# Patient Record
Sex: Male | Born: 1950
Health system: Southern US, Community
[De-identification: ages and names within clinical notes are randomized; demographics above are authoritative.]

## PROBLEM LIST (undated history)

## (undated) DIAGNOSIS — Z8672 Personal history of thrombophlebitis: Secondary | ICD-10-CM

## (undated) DIAGNOSIS — N529 Male erectile dysfunction, unspecified: Secondary | ICD-10-CM

## (undated) DIAGNOSIS — E7800 Pure hypercholesterolemia, unspecified: Secondary | ICD-10-CM

## (undated) DIAGNOSIS — Z9889 Other specified postprocedural states: Secondary | ICD-10-CM

## (undated) DIAGNOSIS — I10 Essential (primary) hypertension: Secondary | ICD-10-CM

## (undated) DIAGNOSIS — IMO0002 Reserved for concepts with insufficient information to code with codable children: Secondary | ICD-10-CM

## (undated) DIAGNOSIS — Z7901 Long term (current) use of anticoagulants: Secondary | ICD-10-CM

## (undated) DIAGNOSIS — G454 Transient global amnesia: Secondary | ICD-10-CM

## (undated) DIAGNOSIS — H81312 Aural vertigo, left ear: Secondary | ICD-10-CM

## (undated) DIAGNOSIS — C61 Malignant neoplasm of prostate: Principal | ICD-10-CM

## (undated) DIAGNOSIS — Z87442 Personal history of urinary calculi: Secondary | ICD-10-CM

## (undated) DIAGNOSIS — I4510 Unspecified right bundle-branch block: Secondary | ICD-10-CM

## (undated) DIAGNOSIS — G43909 Migraine, unspecified, not intractable, without status migrainosus: Secondary | ICD-10-CM

## (undated) DIAGNOSIS — Z86718 Personal history of other venous thrombosis and embolism: Secondary | ICD-10-CM

## (undated) DIAGNOSIS — Z85828 Personal history of other malignant neoplasm of skin: Secondary | ICD-10-CM

## (undated) DIAGNOSIS — D6851 Activated protein C resistance: Secondary | ICD-10-CM

## (undated) DIAGNOSIS — J189 Pneumonia, unspecified organism: Secondary | ICD-10-CM

## (undated) HISTORY — DX: Migraine, unspecified, not intractable, without status migrainosus: G43.909

## (undated) HISTORY — PX: TONSILLECTOMY: SUR1361

## (undated) HISTORY — DX: Essential (primary) hypertension: I10

## (undated) HISTORY — PX: KIDNEY STONE SURGERY: SHX686

## (undated) HISTORY — DX: Transient global amnesia: G45.4

---

## 2010-01-20 ENCOUNTER — Encounter: Admission: RE | Admit: 2010-01-20 | Discharge: 2010-01-20 | Payer: Self-pay | Admitting: Internal Medicine

## 2010-04-13 DIAGNOSIS — Z8672 Personal history of thrombophlebitis: Secondary | ICD-10-CM

## 2010-04-13 HISTORY — DX: Personal history of thrombophlebitis: Z86.72

## 2010-05-10 ENCOUNTER — Ambulatory Visit: Payer: Self-pay | Admitting: Vascular Surgery

## 2010-06-13 HISTORY — PX: COLONOSCOPY: SHX174

## 2010-10-26 NOTE — Procedures (Signed)
DUPLEX DEEP VENOUS EXAM - LOWER EXTREMITY   INDICATION:  Right lower extremity pain, swelling, redness.  History of  right lower extremity DVT.   HISTORY:  Edema:  No.  Trauma/Surgery:  No.  Pain:  Yes.  PE:  No.  Previous DVT:  Right DVT in 2003.  Anticoagulants:  Aspirin.  Other:   DUPLEX EXAM:                CFV   SFV   PopV  PTV    GSV                R  L  R  L  R  L  R   L  R  L  Thrombosis    0  0  0     0     +      0  Spontaneous   +  +  +     +     0      +  Phasic        +  +  +     +     0      +  Augmentation  +  +  +     +     0      +  Compressible  +  +  +     +     0      +  Competent     +  +  +     +     0      +   Legend:  + - yes  o - no  p - partial  D - decreased   IMPRESSION:  1. Right lower extremity is positive for deep venous thrombosis in the      posterior tibial veins from the proximal calf to the distal calf.  2. Superficial phlebitis is also noted in the mid calf.  3. Left common femoral vein appears to be negative for deep venous      thrombosis.  4. Preliminary report called to Corrie Dandy, FNP - the patient released to go      home and the doctor will call him this evening.        _____________________________  Di Kindle. Edilia Bo, M.D.   NT/MEDQ  D:  05/11/2010  T:  05/11/2010  Job:  [id number]

## 2010-12-16 ENCOUNTER — Encounter (INDEPENDENT_AMBULATORY_CARE_PROVIDER_SITE_OTHER): Payer: Self-pay | Admitting: Surgery

## 2010-12-21 ENCOUNTER — Encounter (INDEPENDENT_AMBULATORY_CARE_PROVIDER_SITE_OTHER): Payer: Self-pay | Admitting: Surgery

## 2010-12-22 ENCOUNTER — Ambulatory Visit (INDEPENDENT_AMBULATORY_CARE_PROVIDER_SITE_OTHER): Payer: 59 | Admitting: Surgery

## 2010-12-22 ENCOUNTER — Encounter (INDEPENDENT_AMBULATORY_CARE_PROVIDER_SITE_OTHER): Payer: Self-pay | Admitting: Surgery

## 2010-12-22 VITALS — BP 152/110 | HR 64 | Temp 97.0°F | Ht 75.0 in | Wt 242.4 lb

## 2010-12-22 DIAGNOSIS — D6859 Other primary thrombophilia: Secondary | ICD-10-CM

## 2010-12-22 DIAGNOSIS — K429 Umbilical hernia without obstruction or gangrene: Secondary | ICD-10-CM | POA: Insufficient documentation

## 2010-12-22 DIAGNOSIS — I10 Essential (primary) hypertension: Secondary | ICD-10-CM

## 2010-12-22 DIAGNOSIS — D6851 Activated protein C resistance: Secondary | ICD-10-CM

## 2010-12-22 NOTE — Progress Notes (Signed)
The patient is a 60 year old white male who is a patient of Dr. Merri Brunette. He comes for my evaluation of an umbilical hernia.  The patient has noticed an umbilical hernia about 5 years. He first noticed this when he was doing some weightlifting or training. In the last 6 months or so he's noticed an increased size of the hernia. He is also noticed some increased pain with hernia which is brief.  He's had no prior abdominal surgery. He denies a history of stomach, liver, gallbladder, pancreas, or colon disease. He has been found to have benign liver cyst. He had a colonoscopy at age 67 performed in [place].  ROS: Neurologic: He had headaches as a teenager. He has had no stroke. Cardiac: He has been hypertensive for 40 years. She's never had a heart attack, no cardiac tissues. Pulmonary: He has no history of asthma. Does not smoke cigarettes. Gastrointestinal: See history of present illness. Urologic: No history of kidney or prostate disease. Hematologic: He has had 2 events were blood clots in his right leg. He has been diagnosed with factor V Leiden deficiency. He is on chronic Coumadin for the blood clots. Musculoskeletal: He had a compression fracture of his lower back 20 years ago and has some arthritis in that joint.  Personal history: He is accompanied by his wife. He works at the [employer] for the [employer].  PE: Head: HEENT normal. Neck: Supple, no thyroid mass. Lymph nodes: No cervical or suprapubic or adenopathy. Lungs: Clear. Heart: Regular rate and rhythm, no murmur. Abdomen: Soft, no mass, no organomegaly. He has a small reducible umbilical hernia. I feel no inguinal hernia. Extremities: Good strength in upper and lower extremity.  Data Reviewed: I reviewed the notes from Dr. Hulan Fess office.  Assessement and plan: #1. Small, reducible umbilical hernia. I think the patient has option of continuing to observe the umbilical hernia versus seating the surgery  for the hernia. I gave him literature on umbilical hernias. Discussed the potential palpitations of surgery which include bleeding, infection, nerve injury, recurrence of the hernia. #2. Factor V Leiden deficiency. #3. On Coumadin. This will have to be stopped perioperatively. (Last INR 2.8.  Spoke with Harriett Sine, nurse, Claude Manges, pharmacologist who manages coumadin.) #4. Hypertension. #5. History of compression fracture of low back.

## 2010-12-22 NOTE — Patient Instructions (Addendum)
#  1. Discussed options of repair of umbilical hernia.  #2. Given literature on umbilical hernia.  #3. Will need to stop Coumadin 5 days prior surgery.  Harriett Sine, Glynis Smiles - pharmacologist)

## 2011-02-10 DIAGNOSIS — K429 Umbilical hernia without obstruction or gangrene: Secondary | ICD-10-CM

## 2011-02-10 HISTORY — PX: UMBILICAL HERNIA REPAIR: SHX196

## 2011-02-21 ENCOUNTER — Encounter (INDEPENDENT_AMBULATORY_CARE_PROVIDER_SITE_OTHER): Payer: Self-pay

## 2011-02-24 ENCOUNTER — Ambulatory Visit (INDEPENDENT_AMBULATORY_CARE_PROVIDER_SITE_OTHER): Payer: 59 | Admitting: Surgery

## 2011-02-24 ENCOUNTER — Encounter (INDEPENDENT_AMBULATORY_CARE_PROVIDER_SITE_OTHER): Payer: Self-pay | Admitting: Surgery

## 2011-02-24 VITALS — BP 160/110 | HR 78

## 2011-02-24 DIAGNOSIS — Z87898 Personal history of other specified conditions: Secondary | ICD-10-CM

## 2011-02-24 DIAGNOSIS — Z789 Other specified health status: Secondary | ICD-10-CM

## 2011-02-24 NOTE — Progress Notes (Signed)
ASSESSMENT AND PLAN: 1. Small, reducible umbilical hernia. Repaired 02/10/2011 at [facility].    2. Factor V Leiden deficiency.  3. On Coumadin. This will have to be stopped perioperatively. (Last INR 2.8. Spoke with Craig Booth, nurse, Craig Booth, pharmacologist who manages coumadin.)  4. Hypertension.  5. History of compression fracture of low back.   HISTORY OF PRESENT ILLNESS: Post op follow up.  Craig Booth is a 60 y.o. (DOB: 17-Dec-1950)  white male who is a patient of PHARR,WALTER DAVIDSON, MD and comes to me today for umbilical hernia follow up.  He has done well with the repair.  He is back on coumadin, but has not been tested yet.  We talked about return to activity.  PHYSICAL EXAM: BP 160/110  Pulse 78  Abdomen:  Wound looks good.  No bruising.  Wearing abdominal binder.  I encouraged him to wear this for about 4 weeks total.  DATA REVIEWED: None.

## 2011-02-25 ENCOUNTER — Encounter (INDEPENDENT_AMBULATORY_CARE_PROVIDER_SITE_OTHER): Payer: 59 | Admitting: Surgery

## 2011-03-03 ENCOUNTER — Encounter (INDEPENDENT_AMBULATORY_CARE_PROVIDER_SITE_OTHER): Payer: 59 | Admitting: Surgery

## 2012-06-13 DIAGNOSIS — C61 Malignant neoplasm of prostate: Secondary | ICD-10-CM | POA: Insufficient documentation

## 2012-06-25 DIAGNOSIS — C61 Malignant neoplasm of prostate: Secondary | ICD-10-CM

## 2012-06-25 HISTORY — DX: Malignant neoplasm of prostate: C61

## 2012-06-25 HISTORY — PX: PROSTATE BIOPSY: SHX241

## 2012-07-10 ENCOUNTER — Encounter: Payer: Self-pay | Admitting: Radiation Oncology

## 2012-07-10 DIAGNOSIS — I809 Phlebitis and thrombophlebitis of unspecified site: Secondary | ICD-10-CM | POA: Insufficient documentation

## 2012-07-10 NOTE — Progress Notes (Signed)
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

## 2012-07-11 ENCOUNTER — Ambulatory Visit
Admission: RE | Admit: 2012-07-11 | Discharge: 2012-07-11 | Disposition: A | Discharge status: Home / Self Care | Payer: 59 | Source: Ambulatory Visit | Attending: Radiation Oncology | Admitting: Radiation Oncology

## 2012-07-11 ENCOUNTER — Encounter: Payer: Self-pay | Admitting: Radiation Oncology

## 2012-07-11 VITALS — BP 128/102 | HR 78 | Temp 97.3°F | Resp 20 | Ht 75.0 in | Wt 236.4 lb

## 2012-07-11 DIAGNOSIS — Z86718 Personal history of other venous thrombosis and embolism: Secondary | ICD-10-CM | POA: Insufficient documentation

## 2012-07-11 DIAGNOSIS — I10 Essential (primary) hypertension: Secondary | ICD-10-CM

## 2012-07-11 DIAGNOSIS — C61 Malignant neoplasm of prostate: Secondary | ICD-10-CM

## 2012-07-11 DIAGNOSIS — D6859 Other primary thrombophilia: Secondary | ICD-10-CM | POA: Insufficient documentation

## 2012-07-11 DIAGNOSIS — Z7901 Long term (current) use of anticoagulants: Secondary | ICD-10-CM | POA: Insufficient documentation

## 2012-07-11 DIAGNOSIS — E7800 Pure hypercholesterolemia, unspecified: Secondary | ICD-10-CM | POA: Insufficient documentation

## 2012-07-11 DIAGNOSIS — Z79899 Other long term (current) drug therapy: Secondary | ICD-10-CM | POA: Insufficient documentation

## 2012-07-11 HISTORY — DX: Male erectile dysfunction, unspecified: N52.9

## 2012-07-11 HISTORY — DX: Activated protein C resistance: D68.51

## 2012-07-11 HISTORY — DX: Reserved for concepts with insufficient information to code with codable children: IMO0002

## 2012-07-11 HISTORY — DX: Malignant neoplasm of prostate: C61

## 2012-07-11 HISTORY — DX: Pure hypercholesterolemia, unspecified: E78.00

## 2012-07-11 NOTE — Progress Notes (Signed)
Radiation Oncology         (336) [phone] ________________________________  Initial outpatient Consultation  Name: Craig Booth MRN: [id number]  Date: 07/11/2012  DOB: [birth year]  AO:ZHYQM,VHQION DAVIDSON, MD  Anner Crete, MD   REFERRING PHYSICIAN: Anner Crete, MD  DIAGNOSIS: 62 y.o. gentleman with stage T2a adenocarcinoma of the prostate with a Gleason's score of 3+3 and a PSA of 3.8  HISTORY OF PRESENT ILLNESS::[name] is a 62 y.o. gentleman.  He was noted to have an elevated PSA of 3.8, up from 2.5 in one year, by his primary care physician, Dr. Renne Crigler.  Accordingly, he was referred for evaluation in urology by Dr. Annabell Howells on 10/17,  digital rectal examination was performed at that time revealing a firm palpable nodule in the left apex about 5 mm deep in a 2+ gland.  The patient proceeded to transrectal ultrasound with 12 biopsies of the prostate on 1/13.  The prostate volume measured 44.68 cc.  Out of 12 core biopsies,3 were positive.  The maximum Gleason score was 3+3, and this was seen in the right base (20%), right mid (<5%), and left mid (70%) cores.  The patient reviewed the biopsy results with his urologist and he has kindly been referred today for discussion of potential radiation treatment options.  PREVIOUS RADIATION THERAPY: No  PAST MEDICAL HISTORY:  has a past medical history of Migraine; Phlebitis; Hypertension; Prostate cancer (06/25/2012); Factor V Leiden mutation; Organic impotence; Hypercholesterolemia; DVT of lower extremity (deep venous thrombosis); Allergy; Skin cancer; Compressed vertebrae; and ED (erectile dysfunction).    PAST SURGICAL HISTORY: Past Surgical History  Procedure Date  . Tonsillectomy   . Umbilical hernia repair 02/10/2011  . Hernia repair   . Prostate biopsy 06/25/2012    Adenocarcinoma    FAMILY HISTORY: family history includes Cancer in his father.  SOCIAL HISTORY:  reports that he has never smoked. He has never used smokeless  tobacco. He reports that he drinks alcohol. He reports that he does not use illicit drugs.  ALLERGIES: Bee venom  MEDICATIONS:  Current Outpatient Prescriptions  Medication Sig Dispense Refill  . Ascorbic Acid (VITAMIN C) 100 MG tablet Take 100 mg by mouth daily.        Marland Kitchen losartan-hydrochlorothiazide (HYZAAR) 100-25 MG per tablet Take 1 tablet by mouth daily.        . Magnesium 400 MG CAPS Take 1 capsule by mouth every other day.      . Multiple Vitamin (MULTIVITAMIN) capsule Take 1 capsule by mouth daily.        Marland Kitchen warfarin (COUMADIN) 5 MG tablet Take 5 mg by mouth daily.          REVIEW OF SYSTEMS:  A 15 point review of systems is documented in the electronic medical record. This was obtained by the nursing staff. However, I reviewed this with the patient to discuss relevant findings and make appropriate changes.  A comprehensive review of systems was negative..  The patient completed an IPSS and IIEF questionnaire.  His IPSS score was 2 indicating mild urinary outflow obstructive symptoms.  He indicated that his erectile function is able to complete sexual activity about half the time.   PHYSICAL EXAM: This patient is in no acute distress.  He is alert and oriented.   height is 6\' 3"  (1.905 m) and weight is 236 lb 6.4 oz (107.23 kg). His oral temperature is 97.3 F (36.3 C). His blood pressure is 128/102 and his pulse is 78. His respiration  is 20.  He exhibits no respiratory distress or labored breathing.  He appears neurologically intact.  His mood is pleasant.  His affect is appropriate.  Please note the digital rectal exam findings described above.  IMPRESSION: This gentleman is a 62 y.o. gentleman with stage T2a adenocarcinoma of the prostate with a Gleason's score of 3+3 and a PSA of 3.8.  His T-Stage, Gleason's Score, and PSA put him into the favorable risk group.  Accordingly he is eligible for a variety of potential treatment options including watchful waiting, prostatectomy, or  radiation with external treatment or seeds.  PLAN:Today I reviewed the findings and workup thus far.  We discussed the natural history of prostate cancer.  We reviewed the the implications of T-stage, Gleason's Score, and PSA on decision-making and outcomes in prostate cancer.  We discussed radiation treatment in the management of prostate cancer with regard to the logistics and delivery of external beam radiation treatment as well as the logistics and delivery of prostate brachytherapy.  We compared and contrasted each of these approaches and also compared these against prostatectomy.  The patient expressed interest in prostate brachytherapy.  I filled out a patient counseling form for him with relevant treatment diagrams and we retained a copy for our records.   The patient is still gathering information, but is leaning toward prostate brachytherapy.  I will share my findings with Dr. Annabell Howells and if he decides for brachytherapy, we will move forward with scheduling the procedure in the near future.     I enjoyed meeting with him today, and will look forward to participating in the care of this very nice gentleman.   I spent 60 minutes face to face with the patient and more than 50% of that time was spent in counseling and/or coordination of care.   ------------------------------------------------  Artist Pais. Kathrynn Running, M.D.

## 2012-07-11 NOTE — Progress Notes (Signed)
Please see the Nurse Progress Note in the MD Initial Consult Encounter for this patient. 

## 2012-09-03 ENCOUNTER — Encounter: Payer: Self-pay | Admitting: Radiation Oncology

## 2012-09-03 NOTE — Progress Notes (Signed)
  Radiation Oncology         (336) [phone] ________________________________  Name: Craig Booth MRN: [id number]  Date: 09/03/2012  DOB: [birth year]  Telephone contact:  I received a message to call this patient and returned the phone call.  I reached his voicemail and left a message giving my desk phone number with voicemail.  I also indicated my hours of availability over the next few days. ________________________________  [name], M.D.

## 2012-09-10 ENCOUNTER — Telehealth: Payer: Self-pay | Admitting: *Deleted

## 2012-09-10 ENCOUNTER — Other Ambulatory Visit: Payer: Self-pay | Admitting: Urology

## 2012-09-10 NOTE — Telephone Encounter (Signed)
Called patient to inform of pre-seed appt. For 09-14-12 at 10:00 am, lvm for a return call

## 2012-09-11 ENCOUNTER — Telehealth: Payer: Self-pay | Admitting: *Deleted

## 2012-09-11 NOTE — Telephone Encounter (Signed)
Called patient to inform of implant date, spoke with patient and he is aware of this date. 

## 2012-09-13 ENCOUNTER — Telehealth: Payer: Self-pay | Admitting: *Deleted

## 2012-09-13 NOTE — Telephone Encounter (Signed)
CALLED PATIENT TO REMIND OF APPT. FOR PRE-SEED CT ON 09-14-12 AT 10:00 AM, LVM FOR A RETURN CALL

## 2012-09-14 ENCOUNTER — Encounter (HOSPITAL_BASED_OUTPATIENT_CLINIC_OR_DEPARTMENT_OTHER)
Admission: RE | Admit: 2012-09-14 | Discharge: 2012-09-14 | Disposition: A | Discharge status: Home / Self Care | Payer: 59 | Source: Ambulatory Visit | Attending: Urology | Admitting: Urology

## 2012-09-14 ENCOUNTER — Ambulatory Visit (HOSPITAL_BASED_OUTPATIENT_CLINIC_OR_DEPARTMENT_OTHER)
Admission: RE | Admit: 2012-09-14 | Discharge: 2012-09-14 | Disposition: A | Discharge status: Home / Self Care | Payer: 59 | Source: Ambulatory Visit | Attending: Urology | Admitting: Urology

## 2012-09-14 ENCOUNTER — Ambulatory Visit
Admission: RE | Admit: 2012-09-14 | Discharge: 2012-09-14 | Disposition: A | Discharge status: Home / Self Care | Payer: 59 | Source: Ambulatory Visit | Attending: Radiation Oncology | Admitting: Radiation Oncology

## 2012-09-14 DIAGNOSIS — I10 Essential (primary) hypertension: Secondary | ICD-10-CM | POA: Insufficient documentation

## 2012-09-14 DIAGNOSIS — C61 Malignant neoplasm of prostate: Secondary | ICD-10-CM | POA: Insufficient documentation

## 2012-09-14 DIAGNOSIS — Z01818 Encounter for other preprocedural examination: Secondary | ICD-10-CM | POA: Insufficient documentation

## 2012-09-14 DIAGNOSIS — Z01810 Encounter for preprocedural cardiovascular examination: Secondary | ICD-10-CM | POA: Insufficient documentation

## 2012-09-14 NOTE — Progress Notes (Signed)
  Radiation Oncology         [phone]) [phone] ________________________________  Name: Craig Booth MRN: [id number]  Date: 09/14/2012  DOB: 1951/06/01  SIMULATION AND TREATMENT PLANNING NOTE PUBIC ARCH STUDY  WJ:XBJYN,WGNFAO Ignacia Palma, MD  Anner Crete, MD  DIAGNOSIS: 62 y.o. gentleman with stage T2a adenocarcinoma of the prostate with a Gleason's score of 3+3 and a PSA of 3.8  COMPLEX SIMULATION:  The patient presented today for evaluation for possible prostate seed implant. He was brought to the radiation planning suite and placed supine on the CT couch. A 3-dimensional image study set was obtained in upload to the planning computer. There, on each axial slice, I contoured the prostate gland. Then, using three-dimensional radiation planning tools I reconstructed the prostate in view of the structures from the transperineal needle pathway to assess for possible pubic arch interference. In doing so, I did not appreciate any pubic arch interference. Also, the patient's prostate volume was estimated based on the drawn structure. The volume was 46 cc.  Given the pubic arch appearance and prostate volume, patient remains a good candidate to proceed with prostate seed implant. Today, he freely provided informed written consent to proceed.    PLAN: The patient will undergo prostate seed implant.   ________________________________  Artist Pais. Kathrynn Running, M.D.

## 2012-10-18 ENCOUNTER — Other Ambulatory Visit: Payer: Self-pay | Admitting: Internal Medicine

## 2012-10-18 ENCOUNTER — Ambulatory Visit
Admission: RE | Admit: 2012-10-18 | Discharge: 2012-10-18 | Disposition: A | Discharge status: Home / Self Care | Payer: 59 | Source: Ambulatory Visit | Attending: Internal Medicine | Admitting: Internal Medicine

## 2012-10-18 DIAGNOSIS — R42 Dizziness and giddiness: Secondary | ICD-10-CM

## 2012-10-18 DIAGNOSIS — H5500 Unspecified nystagmus: Secondary | ICD-10-CM

## 2012-10-31 ENCOUNTER — Encounter (HOSPITAL_BASED_OUTPATIENT_CLINIC_OR_DEPARTMENT_OTHER): Payer: Self-pay | Admitting: *Deleted

## 2012-10-31 ENCOUNTER — Telehealth: Payer: Self-pay | Admitting: *Deleted

## 2012-10-31 NOTE — Telephone Encounter (Signed)
Called patient to remind of appt., lvm for a return call 

## 2012-11-02 ENCOUNTER — Encounter (HOSPITAL_BASED_OUTPATIENT_CLINIC_OR_DEPARTMENT_OTHER): Payer: Self-pay | Admitting: *Deleted

## 2012-11-02 NOTE — Progress Notes (Signed)
NPO AFTER MN. ARRIVES AT 0615. CURRENT CXR AND EKG IN EPIC AND CHART. LAB WORK TO BE DONE ON 11-06-2012 AT 0800. WILL DO FLEET ENEMA AM OF SURG .

## 2012-11-06 LAB — COMPREHENSIVE METABOLIC PANEL
ALT: 33 U/L (ref 0–53)
Albumin: 3.6 g/dL (ref 3.5–5.2)
CO2: 28 mEq/L (ref 19–32)
Creatinine, Ser: 1.08 mg/dL (ref 0.50–1.35)
GFR calc non Af Amer: 72 mL/min — ABNORMAL LOW (ref >90)
Potassium: 4.1 mEq/L (ref 3.5–5.1)

## 2012-11-06 LAB — CBC
Hemoglobin: 14.5 g/dL (ref 13.0–17.0)
MCV: 86.6 fL (ref 78.0–100.0)
RBC: 4.64 MIL/uL (ref 4.22–5.81)

## 2012-11-07 ENCOUNTER — Telehealth: Payer: Self-pay | Admitting: *Deleted

## 2012-11-07 NOTE — Telephone Encounter (Signed)
Called patient to remind of procedure for 11-08-12, spoke with patient and he is aware of this procedure

## 2012-11-07 NOTE — H&P (Signed)
istory of Present Illness   Craig Booth returns today to discuss his biopsy results.  He has T2a Nx Mx Gleason 6 prostate cancer.    He had 70% involvement of the left mid medial core which was the location of his nodule.  He had 2 cores at the right base medial and right mid medial with <5 and 20% involvement and he had 4 cores with HGPIN.  His prostate volume is 44cc.  His IPSS is 2 and he has moderate ED with a SHIM of 14.  A Kattan nomogram predicts a 23% chance of indolent disease, an 86% chance of organ confined disease, a 19% chance of ECE, a 1% chance of SVI and a 1.3% chance of LNI.  He has had a prior umbilical hernia repair and has factor V Lieden deficiency and is on warfarin.   Past Medical History Problems  1. History of  Hypercholesterolemia 272.0 2. History of  Hypertension 401.9 3. History of  Venous Thrombosis Of The Deep Vessels Of The Lower Extremity 453.40  Surgical History Problems  1. History of  Tonsillectomy 2. History of  Umbilical Hernia Repair  Current Meds 1. CefTRIAXone Sodium 1 GM Injection Solution Reconstituted; INJECT 1  GM Intramuscular; To Be  Done: 13Jan2014; Status: HOLD FOR - Administration 2. Hydrochlorothiazide 25 MG Oral Tablet; Therapy: (Recorded:05Nov2013) to 3. Levofloxacin 500 MG Oral Tablet; Take one tablet daily starting day before procedure; Therapy:  17Oct2013 to (Evaluate:11Nov2013)  Requested for: 08Nov2013; Last Rx:08Nov2013 4. Losartan Potassium 100 MG Oral Tablet; Therapy: (Recorded:17Oct2013) to 5. Magnesium TABS; Therapy: (Recorded:17Oct2013) to 6. Multi-Vitamin Oral Tablet; Therapy: (Recorded:17Oct2013) to 7. Vitamin C TABS; Therapy: (Recorded:17Oct2013) to 8. Warfarin Sodium 5 MG Oral Tablet; Therapy: (Recorded:17Oct2013) to  Allergies Medication  1. No Known Drug Allergies  Family History Problems  1. Paternal history of  Alzheimer's Disease 2. Paternal history of  Brain Tumor 3. Family history of  Death In The Family  Father 4. Family history of  Family Health Status - Mother's Age age 73 5. Family history of  Family Health Status Number Of Children 2 sons 6. Maternal history of  Multiple Sclerosis 7. Paternal history of  Nephrolithiasis  Social History Problems  1. Marital History - Currently Married 2. Never A Smoker 3. 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 Denied  4. History of  Alcohol Use 5. History of  Caffeine Use  Review of Systems Genitourinary, constitutional, skin, eye, otolaryngeal, hematologic/lymphatic, cardiovascular, pulmonary, endocrine, musculoskeletal, gastrointestinal, neurological and psychiatric system(s) were reviewed and pertinent findings if present are noted.  Genitourinary: erectile dysfunction.    Physical Exam Constitutional: Well nourished and well developed . No acute distress.  ENT:. The ears and nose are normal in appearance.  Neck: The appearance of the neck is normal and no neck mass is present.  Pulmonary: No respiratory distress and normal respiratory rhythm and effort.  Cardiovascular: Heart rate and rhythm are normal . No peripheral edema.  Abdomen: The abdomen is mildly obese. The abdomen is soft and nontender. No masses are palpated. No CVA tenderness. No hernias are palpable. No hepatosplenomegaly noted.  Rectal: Rectal exam demonstrates normal sphincter tone, no tenderness and no masses. Estimated prostate size is 2+. The prostate has a palpable nodule (about 5mm deep and hard. Could be stone or tumor. ) involving the left, apex of the prostate and is not tender. The left seminal vesicle is nonpalpable. The right seminal vesicle is nonpalpable. The perineum is normal on inspection.  Genitourinary: Examination  of the penis demonstrates no discharge, no masses, no lesions and a normal meatus. The scrotum is without lesions. The right epididymis is palpably normal and non-tender. The left epididymis is palpably normal and non-tender. The right testis is  non-tender and without masses. The left testis is non-tender and without masses.  Lymphatics: The supraclavicular, axillary, femoral and inguinal nodes are not enlarged or tender.  Skin: Normal skin turgor, no visible rash and no visible skin lesions.  Neuro/Psych:. Mood and affect are appropriate.    Assessment Assessed  1. Prostate Cancer 185    He has T2a Nx Mx Gleason 6 prostate cancer with moderate ED and no voiding complaints.  He is on warfarin for factor V Leiden deficiency.   Plan  He is going to have a prostate seed implant for his prostate cancer.   Discussion/Summary   CC: Dr. Merri Brunette.

## 2012-11-08 ENCOUNTER — Ambulatory Visit (HOSPITAL_BASED_OUTPATIENT_CLINIC_OR_DEPARTMENT_OTHER): Payer: 59 | Admitting: Anesthesiology

## 2012-11-08 ENCOUNTER — Encounter (HOSPITAL_BASED_OUTPATIENT_CLINIC_OR_DEPARTMENT_OTHER)
Admission: RE | Disposition: A | Discharge status: Home / Self Care | Payer: Self-pay | Source: Ambulatory Visit | Attending: Urology

## 2012-11-08 ENCOUNTER — Ambulatory Visit (HOSPITAL_BASED_OUTPATIENT_CLINIC_OR_DEPARTMENT_OTHER)
Admission: RE | Admit: 2012-11-08 | Discharge: 2012-11-08 | Disposition: A | Discharge status: Home / Self Care | Payer: 59 | Source: Ambulatory Visit | Attending: Urology | Admitting: Urology

## 2012-11-08 ENCOUNTER — Encounter (HOSPITAL_BASED_OUTPATIENT_CLINIC_OR_DEPARTMENT_OTHER): Payer: Self-pay | Admitting: *Deleted

## 2012-11-08 ENCOUNTER — Ambulatory Visit (HOSPITAL_COMMUNITY): Payer: 59

## 2012-11-08 ENCOUNTER — Encounter (HOSPITAL_BASED_OUTPATIENT_CLINIC_OR_DEPARTMENT_OTHER): Payer: Self-pay | Admitting: Anesthesiology

## 2012-11-08 DIAGNOSIS — Z79899 Other long term (current) drug therapy: Secondary | ICD-10-CM | POA: Insufficient documentation

## 2012-11-08 DIAGNOSIS — I10 Essential (primary) hypertension: Secondary | ICD-10-CM | POA: Insufficient documentation

## 2012-11-08 DIAGNOSIS — E7800 Pure hypercholesterolemia, unspecified: Secondary | ICD-10-CM | POA: Insufficient documentation

## 2012-11-08 DIAGNOSIS — C61 Malignant neoplasm of prostate: Secondary | ICD-10-CM | POA: Insufficient documentation

## 2012-11-08 DIAGNOSIS — D6859 Other primary thrombophilia: Secondary | ICD-10-CM | POA: Insufficient documentation

## 2012-11-08 DIAGNOSIS — Z86718 Personal history of other venous thrombosis and embolism: Secondary | ICD-10-CM | POA: Insufficient documentation

## 2012-11-08 DIAGNOSIS — N529 Male erectile dysfunction, unspecified: Secondary | ICD-10-CM | POA: Insufficient documentation

## 2012-11-08 DIAGNOSIS — Z7901 Long term (current) use of anticoagulants: Secondary | ICD-10-CM | POA: Insufficient documentation

## 2012-11-08 HISTORY — DX: Personal history of other malignant neoplasm of skin: Z85.828

## 2012-11-08 HISTORY — DX: Personal history of other venous thrombosis and embolism: Z86.718

## 2012-11-08 HISTORY — DX: Aural vertigo, left ear: H81.312

## 2012-11-08 HISTORY — PX: RADIOACTIVE SEED IMPLANT: SHX5150

## 2012-11-08 HISTORY — DX: Long term (current) use of anticoagulants: Z79.01

## 2012-11-08 HISTORY — PX: CYSTOSCOPY: SHX5120

## 2012-11-08 HISTORY — DX: Personal history of thrombophlebitis: Z86.72

## 2012-11-08 HISTORY — DX: Personal history of other malignant neoplasm of skin: Z98.890

## 2012-11-08 HISTORY — DX: Unspecified right bundle-branch block: I45.10

## 2012-11-08 SURGERY — INSERTION, RADIATION SOURCE, PROSTATE
Anesthesia: General | Site: Prostate | Wound class: Clean Contaminated

## 2012-11-08 MED ORDER — TAMSULOSIN HCL 0.4 MG PO CAPS: 0.4000 mg | ORAL_CAPSULE | Freq: Every day | ORAL | Status: DC

## 2012-11-08 MED ORDER — HYDROCODONE-ACETAMINOPHEN 5-325 MG PO TABS: 1.0000 | ORAL_TABLET | Freq: Four times a day (QID) | ORAL | Status: DC | PRN

## 2012-11-08 MED ORDER — ONDANSETRON HCL 4 MG/2ML IJ SOLN
4.0000 mg | Freq: Four times a day (QID) | INTRAMUSCULAR | Status: DC | PRN
  Filled 2012-11-08: qty 2

## 2012-11-08 MED ORDER — MEPERIDINE HCL 25 MG/ML IJ SOLN
6.2500 mg | INTRAMUSCULAR | Status: DC | PRN
  Filled 2012-11-08: qty 1

## 2012-11-08 MED ORDER — SODIUM CHLORIDE 0.9 % IV SOLN
250.0000 mL | INTRAVENOUS | Status: DC | PRN
  Filled 2012-11-08: qty 250

## 2012-11-08 MED ORDER — PROMETHAZINE HCL 25 MG/ML IJ SOLN
6.2500 mg | INTRAMUSCULAR | Status: DC | PRN
  Filled 2012-11-08: qty 1

## 2012-11-08 MED ORDER — ACETAMINOPHEN 650 MG RE SUPP
650.0000 mg | RECTAL | Status: DC | PRN
  Filled 2012-11-08: qty 1

## 2012-11-08 MED ORDER — CIPROFLOXACIN HCL 500 MG PO TABS: 500.0000 mg | ORAL_TABLET | Freq: Two times a day (BID) | ORAL | Status: DC

## 2012-11-08 MED ORDER — ACETAMINOPHEN 10 MG/ML IV SOLN
INTRAVENOUS | Status: DC | PRN
  Administered 2012-11-08: 1000 mg via INTRAVENOUS

## 2012-11-08 MED ORDER — OXYCODONE HCL 5 MG PO TABS
5.0000 mg | ORAL_TABLET | ORAL | Status: DC | PRN
  Filled 2012-11-08: qty 2

## 2012-11-08 MED ORDER — FENTANYL CITRATE 0.05 MG/ML IJ SOLN
25.0000 ug | INTRAMUSCULAR | Status: DC | PRN
  Filled 2012-11-08: qty 1

## 2012-11-08 MED ORDER — ACETAMINOPHEN 325 MG PO TABS
650.0000 mg | ORAL_TABLET | ORAL | Status: DC | PRN
  Filled 2012-11-08: qty 2

## 2012-11-08 MED ORDER — LACTATED RINGERS IV SOLN
INTRAVENOUS | Status: DC
  Filled 2012-11-08: qty 1000

## 2012-11-08 MED ORDER — CIPROFLOXACIN IN D5W 400 MG/200ML IV SOLN
400.0000 mg | INTRAVENOUS | Status: AC
  Administered 2012-11-08: 400 mg via INTRAVENOUS
  Filled 2012-11-08: qty 200

## 2012-11-08 MED ORDER — DOCUSATE SODIUM 100 MG PO CAPS: 100.0000 mg | ORAL_CAPSULE | Freq: Two times a day (BID) | ORAL | Status: DC

## 2012-11-08 MED ORDER — FLEET ENEMA 7-19 GM/118ML RE ENEM
1.0000 | ENEMA | Freq: Once | RECTAL | Status: DC
  Filled 2012-11-08: qty 1

## 2012-11-08 MED ORDER — FENTANYL CITRATE 0.05 MG/ML IJ SOLN
INTRAMUSCULAR | Status: DC | PRN
  Administered 2012-11-08: 25 ug via INTRAVENOUS
  Administered 2012-11-08: 50 ug via INTRAVENOUS
  Administered 2012-11-08 (×2): 25 ug via INTRAVENOUS

## 2012-11-08 MED ORDER — EPHEDRINE SULFATE 50 MG/ML IJ SOLN
INTRAMUSCULAR | Status: DC | PRN
  Administered 2012-11-08: 5 mg via INTRAVENOUS
  Administered 2012-11-08 (×2): 10 mg via INTRAVENOUS

## 2012-11-08 MED ORDER — STERILE WATER FOR IRRIGATION IR SOLN
Status: DC | PRN
  Administered 2012-11-08: 3000 mL

## 2012-11-08 MED ORDER — ONDANSETRON HCL 4 MG/2ML IJ SOLN
INTRAMUSCULAR | Status: DC | PRN
  Administered 2012-11-08: 4 mg via INTRAVENOUS

## 2012-11-08 MED ORDER — IOHEXOL 350 MG/ML SOLN
INTRAVENOUS | Status: DC | PRN
  Administered 2012-11-08: 7 mL

## 2012-11-08 MED ORDER — DEXAMETHASONE SODIUM PHOSPHATE 4 MG/ML IJ SOLN
INTRAMUSCULAR | Status: DC | PRN
  Administered 2012-11-08: 8 mg via INTRAVENOUS

## 2012-11-08 MED ORDER — SODIUM CHLORIDE 0.9 % IJ SOLN
3.0000 mL | INTRAMUSCULAR | Status: DC | PRN
  Filled 2012-11-08: qty 3

## 2012-11-08 MED ORDER — LIDOCAINE HCL (CARDIAC) 20 MG/ML IV SOLN
INTRAVENOUS | Status: DC | PRN
  Administered 2012-11-08: 75 mg via INTRAVENOUS

## 2012-11-08 MED ORDER — SODIUM CHLORIDE 0.9 % IJ SOLN
3.0000 mL | Freq: Two times a day (BID) | INTRAMUSCULAR | Status: DC
  Filled 2012-11-08: qty 3

## 2012-11-08 MED ORDER — PROPOFOL 10 MG/ML IV BOLUS
INTRAVENOUS | Status: DC | PRN
  Administered 2012-11-08: 250 mg via INTRAVENOUS

## 2012-11-08 MED ORDER — LACTATED RINGERS IV SOLN
INTRAVENOUS | Status: DC
  Administered 2012-11-08 (×2): via INTRAVENOUS
  Administered 2012-11-08: 100 mL/h via INTRAVENOUS
  Filled 2012-11-08: qty 1000

## 2012-11-08 SURGICAL SUPPLY — 28 items
BAG URINE DRAINAGE (UROLOGICAL SUPPLIES) ×3 IMPLANT
BLADE SURG ROTATE 9660 (MISCELLANEOUS) ×3 IMPLANT
CATH FOLEY 2WAY SLVR  5CC 16FR (CATHETERS) ×2
CATH FOLEY 2WAY SLVR 5CC 16FR (CATHETERS) ×4 IMPLANT
CATH ROBINSON RED A/P 20FR (CATHETERS) ×3 IMPLANT
CLOTH BEACON ORANGE TIMEOUT ST (SAFETY) ×3 IMPLANT
COVER MAYO STAND STRL (DRAPES) ×3 IMPLANT
COVER TABLE BACK 60X90 (DRAPES) ×3 IMPLANT
DRSG TEGADERM 4X4.75 (GAUZE/BANDAGES/DRESSINGS) ×3 IMPLANT
DRSG TEGADERM 8X12 (GAUZE/BANDAGES/DRESSINGS) ×3 IMPLANT
GLOVE BIO SURGEON STRL SZ7 (GLOVE) ×3 IMPLANT
GLOVE BIO SURGEON STRL SZ7.5 (GLOVE) ×3 IMPLANT
GLOVE BIOGEL M 8.0 STRL (GLOVE) ×3 IMPLANT
GLOVE BIOGEL PI IND STRL 8.5 (GLOVE) ×2 IMPLANT
GLOVE BIOGEL PI INDICATOR 8.5 (GLOVE) ×1
GLOVE ECLIPSE 8.0 STRL XLNG CF (GLOVE) ×18 IMPLANT
GLOVE INDICATOR 7.0 STRL GRN (GLOVE) ×3 IMPLANT
GLOVE SURG SS PI 8.0 STRL IVOR (GLOVE) ×6 IMPLANT
GOWN PREVENTION PLUS LG XLONG (DISPOSABLE) IMPLANT
GOWN STRL REIN XL XLG (GOWN DISPOSABLE) IMPLANT
HOLDER FOLEY CATH W/STRAP (MISCELLANEOUS) ×3 IMPLANT
NUCLETRON SELECTSEED ×3 IMPLANT
PACK CYSTOSCOPY (CUSTOM PROCEDURE TRAY) ×3 IMPLANT
SPONGE GAUZE 4X4 12PLY (GAUZE/BANDAGES/DRESSINGS) ×3 IMPLANT
SYRINGE 10CC LL (SYRINGE) ×3 IMPLANT
UNDERPAD 30X30 INCONTINENT (UNDERPADS AND DIAPERS) ×6 IMPLANT
WATER STERILE IRR 3000ML UROMA (IV SOLUTION) ×3 IMPLANT
WATER STERILE IRR 500ML POUR (IV SOLUTION) ×3 IMPLANT

## 2012-11-08 NOTE — Anesthesia Preprocedure Evaluation (Addendum)
Anesthesia Evaluation  Patient identified by MRN, date of birth, ID band Patient awake    Reviewed: Allergy & Precautions, H&P , NPO status , Patient's Chart, lab work & pertinent test results  Airway Mallampati: II TM Distance: >3 FB Neck ROM: Full    Dental no notable dental hx.    Pulmonary neg pulmonary ROS,  breath sounds clear to auscultation  Pulmonary exam normal       Cardiovascular hypertension, Pt. on medications DVT - dysrhythmias Rhythm:Regular Rate:Normal     Neuro/Psych negative neurological ROS  negative psych ROS   GI/Hepatic negative GI ROS, Neg liver ROS,   Endo/Other  negative endocrine ROS  Renal/GU negative Renal ROS  negative genitourinary   Musculoskeletal negative musculoskeletal ROS (+)   Abdominal   Peds negative pediatric ROS (+)  Hematology  (+) Blood dyscrasia ( Factor V Leiden mutation), ,   Anesthesia Other Findings   Reproductive/Obstetrics negative OB ROS                           Anesthesia Physical Anesthesia Plan  ASA: II  Anesthesia Plan: General   Post-op Pain Management:    Induction: Intravenous  Airway Management Planned: LMA  Additional Equipment:   Intra-op Plan:   Post-operative Plan: Extubation in OR  Informed Consent: I have reviewed the patients History and Physical, chart, labs and discussed the procedure including the risks, benefits and alternatives for the proposed anesthesia with the patient or authorized representative who has indicated his/her understanding and acceptance.   Dental advisory given  Plan Discussed with: CRNA  Anesthesia Plan Comments:        Anesthesia Quick Evaluation

## 2012-11-08 NOTE — Anesthesia Postprocedure Evaluation (Signed)
  Anesthesia Post-op Note  Patient: Craig Booth  Procedure(s) Performed: Procedure(s) (LRB):  79 RADIOACTIVE SEED IMPLANT (N/A) CYSTOSCOPY FLEXIBLE (N/A)  Patient Location: PACU  Anesthesia Type: General  Level of Consciousness: awake and alert   Airway and Oxygen Therapy: Patient Spontanous Breathing  Post-op Pain: mild  Post-op Assessment: Post-op Vital signs reviewed, Patient's Cardiovascular Status Stable, Respiratory Function Stable, Patent Airway and No signs of Nausea or vomiting  Last Vitals:  Filed Vitals:   11/08/12 1109  BP: 149/88  Pulse: 73  Temp: 36 C  Resp: 16    Post-op Vital Signs: stable   Complications: No apparent anesthesia complications

## 2012-11-08 NOTE — Transfer of Care (Signed)
Immediate Anesthesia Transfer of Care Note  Patient: Craig Booth  Procedure(s) Performed: Procedure(s) (LRB):  79 RADIOACTIVE SEED IMPLANT (N/A) CYSTOSCOPY FLEXIBLE (N/A)  Patient Location: Patient transported to PACU with oxygen via face mask at 4 Liters / Min  Anesthesia Type: General  Level of Consciousness: awake and alert   Airway & Oxygen Therapy: Patient Spontanous Breathing and Patient connected to face mask oxygen  Post-op Assessment: Report given to PACU RN and Post -op Vital signs reviewed and stable  Post vital signs: Reviewed and stable  Dentition: Teeth and oropharynx remain in pre-op condition  Complications: No apparent anesthesia complications

## 2012-11-08 NOTE — Anesthesia Procedure Notes (Signed)
Procedure Name: LMA Insertion Date/Time: 11/08/2012 7:55 AM Performed by: Fran Lowes Pre-anesthesia Checklist: Patient identified, Emergency Drugs available, Suction available and Patient being monitored Patient Re-evaluated:Patient Re-evaluated prior to inductionOxygen Delivery Method: Circle System Utilized Preoxygenation: Pre-oxygenation with 100% oxygen Intubation Type: IV induction Ventilation: Mask ventilation without difficulty LMA: LMA inserted LMA Size: 5.0 Number of attempts: 1 Airway Equipment and Method: bite block Placement Confirmation: positive ETCO2 Tube secured with: Tape Dental Injury: Teeth and Oropharynx as per pre-operative assessment

## 2012-11-08 NOTE — Brief Op Note (Signed)
11/08/2012  9:13 AM  PATIENT:  Craig Booth  62 y.o. male  PRE-OPERATIVE DIAGNOSIS:  PROSTATE CANCER  POST-OPERATIVE DIAGNOSIS:  PROSTATE CANCER  PROCEDURE:  Procedure(s) with comments:  7 RADIOACTIVE SEED IMPLANT (N/A) -     SEEDS IMPLANTED CYSTOSCOPY FLEXIBLE (N/A) - NO SEEDS FOUND IN BLADDER  SURGEON:  Surgeon(s) and Role:    * Anner Crete, MD - Primary    * Oneita Hurt, MD  PHYSICIAN ASSISTANT:   ASSISTANTS: none   ANESTHESIA:   general  EBL:  Total I/O In: 1000 [I.V.:1000] Out: -   BLOOD ADMINISTERED:none  DRAINS: none   LOCAL MEDICATIONS USED:  NONE  SPECIMEN:  No Specimen  DISPOSITION OF SPECIMEN:  N/A  COUNTS:  YES  TOURNIQUET:  * No tourniquets in log *  DICTATION: .Other Dictation: Dictation Number [id number]  PLAN OF CARE: Discharge to home after PACU  PATIENT DISPOSITION:  PACU - hemodynamically stable.   Delay start of Pharmacological VTE agent (>24hrs) due to surgical blood loss or risk of bleeding: no

## 2012-11-08 NOTE — Interval H&P Note (Signed)
History and Physical Interval Note:   He reports no changes since his last visit other than a resolved inner ear infection.  Exam:  Lungs CTA, CV RRR.  11/08/2012 7:19 AM  Craig Booth  has presented today for surgery, with the diagnosis of PROSTATE CANCER  The various methods of treatment have been discussed with the patient and family. After consideration of risks, benefits and other options for treatment, the patient has consented to  Procedure(s) with comments: RADIOACTIVE SEED IMPLANT (N/A) - DR PORTABLE NEEDED as a surgical intervention .  The patient's history has been reviewed, patient examined, no change in status, stable for surgery.  I have reviewed the patient's chart and labs.  Questions were answered to the patient's satisfaction.     [name]

## 2012-11-08 NOTE — Procedures (Signed)
  Radiation Oncology         [phone] ________________________________  Name: Craig Booth MRN: [id number]  Date: 09/10/2012  DOB: 1950/09/03       Prostate Seed Implant  MV:HQION,GEXBMW DAVIDSON, MD  No ref. provider found  DIAGNOSIS: 62 y.o. gentleman with stage T2a adenocarcinoma of the prostate with a Gleason's score of 3+3 and a PSA of 3.8  PROCEDURE: Insertion of radioactive I-125 seeds into the prostate gland.  RADIATION DOSE: 145 Gy, definitive therapy.  TECHNIQUE: Craig Booth was brought to the operating room with the urologist. He was placed in the dorsolithotomy position. He was catheterized and a rectal tube was inserted. The perineum was shaved, prepped and draped. The ultrasound probe was then introduced into the rectum to see the prostate gland.  TREATMENT DEVICE: A needle grid was attached to the ultrasound probe stand and anchor needles were placed.  COMPLEX ISODOSE CALCULATION: The prostate was imaged in 3D using a sagittal sweep of the prostate probe. These images were transferred to the planning computer. There, the prostate, urethra and rectum were defined on each axial reconstructed image. Then, the software created an optimized plan and a few seed positions were adjusted. Then the accepted plan was uploaded to the seed Selectron afterloading unit.  SPECIAL TREATMENT PROCEDURE/SUPERVISION AND HANDLING: The Nucletron FIRST system was used to place the needles under sagittal guidance. A total of 19 needles were used to deposit 79 seeds in the prostate gland. The individual seed activity was 0.503 mCi for a total implant activity of 39.737 mCi.  COMPLEX SIMULATION: At the end of the procedure, an anterior radiograph of the pelvis was obtained to document seed positioning and count. Cystoscopy was performed to check the urethra and bladder.  MICRODOSIMETRY: At the end of the procedure, the patient was emitting 0.15 mrem/hr at 1 meter. Accordingly, he was  considered safe for hospital discharge.  PLAN: The patient will return to the radiation oncology clinic for post implant CT dosimetry in three weeks.   ________________________________  [name], M.D.

## 2012-11-09 ENCOUNTER — Encounter (HOSPITAL_BASED_OUTPATIENT_CLINIC_OR_DEPARTMENT_OTHER): Payer: Self-pay | Admitting: Urology

## 2012-11-09 NOTE — Op Note (Signed)
NAMEAVNER, STRODER             ACCOUNT NO.:  [id number]  MEDICAL RECORD NO.:  [id number]  LOCATION:                                FACILITY:  [facility]  PHYSICIAN:  Excell Seltzer. Annabell Howells, M.D.    DATE OF BIRTH:  08-11-1950  DATE OF PROCEDURE:  11/08/2012 DATE OF DISCHARGE:                              OPERATIVE REPORT   PROCEDURE:  Prostate seed implant and cystoscopy.  PREOPERATIVE DIAGNOSIS:  T2A Gleason 6 adenocarcinoma of the prostate.  POSTOPERATIVE DIAGNOSIS:  T2A Gleason 6 adenocarcinoma of the prostate.  SURGEON:  Excell Seltzer. Annabell Howells, M.D.  RADIATION ONCOLOGIST:  Oneita Hurt, M.D.  ANESTHESIA:  General.  DRAINS:  None.  BLOOD LOSS:  Minimal.  COMPLICATIONS:  None.  INDICATIONS:  Craig Booth is a 62 year old white male who was initially referred for PSA 3.8 and a prostate nodule on the left.  He underwent a biopsy that demonstrated 3 cores out of 12 with Gleason 6 adenocarcinoma of the prostate with the largest volume 70% in the left mid medial core. He has elected seed implantation for treatment.  His prostate volume was only 44 mL and his IPSS was only 2.  FINDINGS OF PROCEDURE:  He was given Cipro and taken to the operating room where general anesthetic was induced.  He was placed in lithotomy position.  He was fitted with PAS hose.  His perineum was clipped.  His genitalia was prepped with Betadine solution and a 16-French Foley catheter was inserted.  The balloon was filled with 10 mL of dilute contrast and the genitals were reflected superiorly with an OpSite.  A red rubber rectal catheter was placed and secured.  The ultrasound probe was assembled and secured to the fixed base and positioned.  Once appropriately positioned, the perineum was prepped.  He was draped in usual sterile fashion.  The sterile grid was applied and stabilization needles were placed.  At this point, Dr. Kathrynn Running and the physicist, Merri Ray, performed the prostate contour and seed planning using  Nucletron device.  Once an adequate plane had been developed, I returned to the field and placed the needles according to the intraoperative plan, a total of 19 active needles were used with 79 seeds of iodine-125 seed.  Once all seeds were in position, the stabilization needles were removed along with the ultrasound probe and fluoroscopy was used to assess the seed placement.  At this point, the red rubber rectal catheter was removed.  The drape was removed and the genitals along with a Foley catheter.  The genitalia was reprepped with Betadine and cystoscopy was performed using a 16- French flexible scope.  Examination revealed a normal urethra.  The external sphincter was intact.  The prostatic urethra short without obstruction and no seeds or bleeding were noted in the prostatic urethra or bladder.  The bladder had minimal trabeculation with no mucosal lesions.  The ureteral orifices were unremarkable.  No seeds were seen.  At this point, the cystoscope was removed.  The bladder was drained with a Foley catheter, which was then removed.  The perineum was cleansed. The dressing was applied.  The patient was taken down from lithotomy position.  His anesthetic was reversed.  He was moved to the recovery room in stable condition.  There were no complications.     Excell Seltzer. Annabell Howells, M.D.     JJW/MEDQ  D:  11/08/2012  T:  11/09/2012  Job:  [id number]  cc:   Oneita Hurt, M.D. Fax: [phone]  Soyla Murphy. Renne Crigler, M.D. Fax: [phone]

## 2012-11-28 ENCOUNTER — Telehealth: Payer: Self-pay | Admitting: *Deleted

## 2012-11-28 NOTE — Telephone Encounter (Signed)
CALLED PATIENT TO REMIND OF APPTS. FOR 11-29-12, LVM FOR A RETURN CALL

## 2012-11-29 ENCOUNTER — Encounter: Payer: Self-pay | Admitting: Radiation Oncology

## 2012-11-29 ENCOUNTER — Ambulatory Visit
Admission: RE | Admit: 2012-11-29 | Discharge: 2012-11-29 | Disposition: A | Discharge status: Home / Self Care | Payer: 59 | Source: Ambulatory Visit | Attending: Radiation Oncology | Admitting: Radiation Oncology

## 2012-11-29 VITALS — BP 138/86 | HR 70 | Temp 97.6°F | Ht 75.0 in | Wt 242.0 lb

## 2012-11-29 DIAGNOSIS — C61 Malignant neoplasm of prostate: Secondary | ICD-10-CM

## 2012-11-29 NOTE — Progress Notes (Signed)
  Radiation Oncology         [phone] ________________________________  Name: Craig Booth MRN: [id number]  Date: 11/29/2012  DOB: July 15, 1950  COMPLEX SIMULATION NOTE  NARRATIVE:  The patient was brought to the CT Simulation planning suite today following prostate seed implantation approximately one month ago.  Identity was confirmed.  All relevant records and images related to the planned course of therapy were reviewed.  Then, the patient was set-up supine.  CT images were obtained.  The CT images were loaded into the planning software.  Then the prostate and rectum were contoured.  Treatment planning then occurred.  The implanted iodine 125 seeds were identified by the physics staff for projection of radiation distribution  I have requested : 3D Simulation  I have requested a DVH of the following structures: Prostate and rectum.    ________________________________  [name], M.D.

## 2012-11-29 NOTE — Progress Notes (Signed)
Radiation Oncology         [phone] ________________________________  Name: Craig Booth MRN: [id number]  Date: 11/29/2012  DOB: 11/23/1950  Follow-Up Visit Note  CC: Londell Moh, MD  Londell Moh,*  Diagnosis:   62 y.o. gentleman with stage T2a adenocarcinoma of the prostate with a Gleason's score of 3+3 and a PSA of 3.8  Interval Since Last Radiation:  3  weeks  Narrative:  The patient returns today for routine follow-up.  He is complaining of increased urinary frequency and urinary hesitation symptoms. He filled out a questionnaire regarding urinary function today providing and overall IPSS score of 5 characterizing his symptoms as mild.  His pre-implant score was 2. He denies any bowel symptoms.  ALLERGIES:  is allergic to bee venom.  Meds: Current Outpatient Prescriptions  Medication Sig Dispense Refill  . losartan-hydrochlorothiazide (HYZAAR) 100-25 MG per tablet Take 1 tablet by mouth daily.       . Magnesium 400 MG CAPS Take 1 capsule by mouth every other day.      . Multiple Vitamin (MULTIVITAMIN) capsule Take 1 capsule by mouth daily.       . vitamin C (ASCORBIC ACID) 500 MG tablet Take 500 mg by mouth daily.      Marland Kitchen warfarin (COUMADIN) 5 MG tablet Take 5 mg by mouth daily.       . ciprofloxacin (CIPRO) 500 MG tablet Take 1 tablet (500 mg total) by mouth 2 (two) times daily.  6 tablet  0  . docusate sodium (COLACE) 100 MG capsule Take 1 capsule (100 mg total) by mouth 2 (two) times daily.  60 capsule  2  . HYDROcodone-acetaminophen (NORCO) 5-325 MG per tablet Take 1 tablet by mouth every 6 (six) hours as needed for pain.  30 tablet  0  . tamsulosin (FLOMAX) 0.4 MG CAPS Take 1 capsule (0.4 mg total) by mouth daily.  30 capsule  1   No current facility-administered medications for this encounter.    Physical Findings: The patient is in no acute distress. Patient is alert and oriented.  height is 6\' 3"  (1.905 m) and weight is 242 lb (109.77 kg).  His temperature is 97.6 F (36.4 C). His blood pressure is 138/86 and his pulse is 70. Marland Kitchen  No significant changes.  Lab Findings: Lab Results  Component Value Date   WBC 6.0 11/06/2012   HGB 14.5 11/06/2012   HCT 40.2 11/06/2012   MCV 86.6 11/06/2012   PLT 264 11/06/2012    Radiographic Findings:  Patient underwent CT imaging in our clinic for post implant dosimetry. The CT appears to demonstrate an adequate distribution of radioactive seeds throughout the prostate gland. There no seeds in her near the rectum. I suspect the final radiation plan and dosimetry will show appropriate coverage of the prostate gland.   Impression: The patient is recovering from the effects of radiation. His urinary symptoms are unfortunately minimal and should gradually improve further over the next 4-6 months. We talked about this today. He is encouraged by his almost lack of urinary symptoms already and is please with his outcome.   Plan: Today, I spent time talking to the patient about his prostate seed implant and resolving urinary symptoms. We also talked about long-term follow-up for prostate cancer following seed implant. He understands that ongoing PSA determinations and digital rectal exams will help perform surveillance to rule out disease recurrence. He understands what to expect with his PSA measures. Patient was also educated today about  some of the long-term effects would radiation including the Small risk for rectal bleeding and possibly erectile dysfunction. Talked about some of the general management approaches to these potential complications. However, I did encourage the patient to contact her office or return at any point if he has questions or concerns related to his previous radiation and prostate cancer.   _____________________________________  Artist Pais. Kathrynn Running, M.D.

## 2012-11-29 NOTE — Progress Notes (Signed)
Craig Booth here with his wife for post seed follow up.  His seeds were placed 11/08/2012.  He denies pain.  He reports the only changes he has noticed are an increase in the amount of times he has to urinate.  He also states it feels different to urinate.  He also has hemorrhoids which have been irritated and bleeding a little after the procedure.  He states his stools are looser than normal.

## 2013-01-02 ENCOUNTER — Ambulatory Visit
Admission: RE | Admit: 2013-01-02 | Discharge: 2013-01-02 | Disposition: A | Discharge status: Home / Self Care | Payer: 59 | Source: Ambulatory Visit | Attending: Radiation Oncology | Admitting: Radiation Oncology

## 2013-01-02 ENCOUNTER — Encounter: Payer: Self-pay | Admitting: Radiation Oncology

## 2013-01-02 DIAGNOSIS — C61 Malignant neoplasm of prostate: Secondary | ICD-10-CM | POA: Insufficient documentation

## 2013-01-08 NOTE — Progress Notes (Signed)
  Radiation Oncology         [phone] ________________________________  Name: Craig Booth MRN: [id number]  Date: 01/02/2013  DOB: July 09, 1950  3-D Planning Note Prostate Brachytherapy  Diagnosis: 62 y.o. gentleman with stage T2a adenocarcinoma of the prostate with a Gleason's score of 3+3 and a PSA of 3.8  Narrative: On a previous date, Craig Booth returned following prostate seed implantation for post implant planning. He underwent CT scan complex simulation to delineate the three-dimensional structures of the pelvis and demonstrate the radiation distribution.  Since that time, the seed localization, and 3D planning with dose volume histograms have now been completed.  Results:   Prostate Coverage - The dose of radiation delivered to the 90% or more of the prostate gland (D90) was 108.72% of the prescription dose. This exceeds our goal of greater than 90%. Rectal Sparing - The volume of rectal tissue receiving the prescription dose or higher was 0.04 cc. This falls under our thresholds tolerance of 1.0 cc.  Impression: The prostate seed implant appears to show adequate target coverage and appropriate rectal sparing.  Plan:  The patient will continue to follow with urology for ongoing PSA determinations. I would anticipate a high likelihood for local tumor control with minimal risk for rectal morbidity.  ________________________________  [name], M.D.

## 2013-05-02 NOTE — Addendum Note (Signed)
Encounter addended by: Lowella Petties, RN on: 05/02/2013  1:42 PM<BR>     Documentation filed: Inpatient Document Flowsheet

## 2014-10-01 ENCOUNTER — Ambulatory Visit (HOSPITAL_COMMUNITY)
Admission: AD | Admit: 2014-10-01 | Discharge: 2014-10-01 | Disposition: A | Discharge status: Home / Self Care | Payer: 59 | Source: Ambulatory Visit | Attending: Urology | Admitting: Urology

## 2014-10-01 ENCOUNTER — Encounter (HOSPITAL_COMMUNITY)
Admission: AD | Disposition: A | Discharge status: Home / Self Care | Payer: Self-pay | Source: Ambulatory Visit | Attending: Urology

## 2014-10-01 ENCOUNTER — Encounter (HOSPITAL_COMMUNITY): Payer: Self-pay | Admitting: Anesthesiology

## 2014-10-01 ENCOUNTER — Ambulatory Visit (HOSPITAL_COMMUNITY): Payer: 59 | Admitting: Registered Nurse

## 2014-10-01 ENCOUNTER — Other Ambulatory Visit: Payer: Self-pay | Admitting: Urology

## 2014-10-01 DIAGNOSIS — N201 Calculus of ureter: Secondary | ICD-10-CM | POA: Diagnosis present

## 2014-10-01 DIAGNOSIS — I10 Essential (primary) hypertension: Secondary | ICD-10-CM | POA: Diagnosis not present

## 2014-10-01 HISTORY — PX: HOLMIUM LASER APPLICATION: SHX5852

## 2014-10-01 HISTORY — PX: CYSTOSCOPY WITH RETROGRADE PYELOGRAM, URETEROSCOPY AND STENT PLACEMENT: SHX5789

## 2014-10-01 LAB — BASIC METABOLIC PANEL
Anion gap: 12 (ref 5–15)
BUN: 21 mg/dL (ref 6–23)
CALCIUM: 9.4 mg/dL (ref 8.4–10.5)
CO2: 25 mmol/L (ref 19–32)
CREATININE: 1.21 mg/dL (ref 0.50–1.35)
Chloride: 104 mmol/L (ref 96–112)
GFR calc Af Amer: 72 mL/min — ABNORMAL LOW (ref >90)
GFR, EST NON AFRICAN AMERICAN: 62 mL/min — AB (ref >90)
GLUCOSE: 128 mg/dL — AB (ref 70–99)
Potassium: 3.6 mmol/L (ref 3.5–5.1)
Sodium: 141 mmol/L (ref 135–145)

## 2014-10-01 SURGERY — CYSTOURETEROSCOPY, WITH RETROGRADE PYELOGRAM AND STENT INSERTION
Anesthesia: General | Laterality: Right

## 2014-10-01 MED ORDER — PROMETHAZINE HCL 25 MG/ML IJ SOLN: 6.2500 mg | INTRAMUSCULAR | Status: DC | PRN

## 2014-10-01 MED ORDER — DEXTROSE 5 % IV SOLN
INTRAVENOUS | Status: AC
  Filled 2014-10-01: qty 2

## 2014-10-01 MED ORDER — NEOSTIGMINE METHYLSULFATE 10 MG/10ML IV SOLN
INTRAVENOUS | Status: AC
  Filled 2014-10-01: qty 1

## 2014-10-01 MED ORDER — HYDROCODONE-ACETAMINOPHEN 5-325 MG PO TABS: 1.0000 | ORAL_TABLET | Freq: Four times a day (QID) | ORAL | Status: DC | PRN

## 2014-10-01 MED ORDER — MIDAZOLAM HCL 2 MG/2ML IJ SOLN
INTRAMUSCULAR | Status: AC
  Filled 2014-10-01: qty 2

## 2014-10-01 MED ORDER — PROPOFOL 10 MG/ML IV BOLUS
INTRAVENOUS | Status: DC | PRN
  Administered 2014-10-01: 200 mg via INTRAVENOUS

## 2014-10-01 MED ORDER — ONDANSETRON HCL 4 MG/2ML IJ SOLN
INTRAMUSCULAR | Status: DC | PRN
  Administered 2014-10-01: 4 mg via INTRAVENOUS

## 2014-10-01 MED ORDER — SODIUM CHLORIDE 0.9 % IJ SOLN
INTRAMUSCULAR | Status: AC
  Filled 2014-10-01: qty 10

## 2014-10-01 MED ORDER — MIDAZOLAM HCL 5 MG/ML IJ SOLN
INTRAMUSCULAR | Status: DC | PRN
  Administered 2014-10-01 (×2): 1 mg via INTRAVENOUS

## 2014-10-01 MED ORDER — GLYCOPYRROLATE 0.2 MG/ML IJ SOLN
INTRAMUSCULAR | Status: DC | PRN
  Administered 2014-10-01: .8 mg via INTRAVENOUS

## 2014-10-01 MED ORDER — SODIUM CHLORIDE 0.9 % IR SOLN
Status: DC | PRN
  Administered 2014-10-01: 4000 mL

## 2014-10-01 MED ORDER — CEFTRIAXONE SODIUM IN DEXTROSE 20 MG/ML IV SOLN
1.0000 g | Freq: Once | INTRAVENOUS | Status: AC
  Administered 2014-10-01: 2 g via INTRAVENOUS

## 2014-10-01 MED ORDER — LACTATED RINGERS IV SOLN: INTRAVENOUS | Status: DC

## 2014-10-01 MED ORDER — NEOSTIGMINE METHYLSULFATE 10 MG/10ML IV SOLN
INTRAVENOUS | Status: DC | PRN
  Administered 2014-10-01: 5 mg via INTRAVENOUS

## 2014-10-01 MED ORDER — LACTATED RINGERS IV SOLN
INTRAVENOUS | Status: DC | PRN
  Administered 2014-10-01: 19:00:00 via INTRAVENOUS

## 2014-10-01 MED ORDER — FENTANYL CITRATE (PF) 100 MCG/2ML IJ SOLN
INTRAMUSCULAR | Status: AC
  Filled 2014-10-01: qty 2

## 2014-10-01 MED ORDER — CISATRACURIUM BESYLATE (PF) 10 MG/5ML IV SOLN
INTRAVENOUS | Status: DC | PRN
  Administered 2014-10-01 (×2): 2 mg via INTRAVENOUS

## 2014-10-01 MED ORDER — FENTANYL CITRATE (PF) 100 MCG/2ML IJ SOLN: 25.0000 ug | INTRAMUSCULAR | Status: DC | PRN

## 2014-10-01 MED ORDER — ONDANSETRON HCL 4 MG/2ML IJ SOLN
INTRAMUSCULAR | Status: AC
  Filled 2014-10-01: qty 2

## 2014-10-01 MED ORDER — ONDANSETRON HCL 4 MG PO TABS: 4.0000 mg | ORAL_TABLET | Freq: Three times a day (TID) | ORAL | Status: DC | PRN

## 2014-10-01 MED ORDER — SUCCINYLCHOLINE CHLORIDE 20 MG/ML IJ SOLN
INTRAMUSCULAR | Status: DC | PRN
  Administered 2014-10-01: 100 mg via INTRAVENOUS

## 2014-10-01 MED ORDER — LIDOCAINE HCL 2 % EX GEL
CUTANEOUS | Status: AC
  Filled 2014-10-01: qty 10

## 2014-10-01 MED ORDER — GLYCOPYRROLATE 0.2 MG/ML IJ SOLN
INTRAMUSCULAR | Status: AC
  Filled 2014-10-01: qty 4

## 2014-10-01 MED ORDER — PROPOFOL 10 MG/ML IV BOLUS
INTRAVENOUS | Status: AC
  Filled 2014-10-01: qty 20

## 2014-10-01 MED ORDER — EPHEDRINE SULFATE 50 MG/ML IJ SOLN
INTRAMUSCULAR | Status: AC
  Filled 2014-10-01: qty 1

## 2014-10-01 MED ORDER — LIDOCAINE HCL (CARDIAC) 20 MG/ML IV SOLN
INTRAVENOUS | Status: AC
  Filled 2014-10-01: qty 5

## 2014-10-01 MED ORDER — DEXAMETHASONE SODIUM PHOSPHATE 10 MG/ML IJ SOLN
INTRAMUSCULAR | Status: DC | PRN
  Administered 2014-10-01: 10 mg via INTRAVENOUS

## 2014-10-01 MED ORDER — FENTANYL CITRATE (PF) 100 MCG/2ML IJ SOLN
INTRAMUSCULAR | Status: DC | PRN
  Administered 2014-10-01 (×3): 50 ug via INTRAVENOUS

## 2014-10-01 MED ORDER — IOHEXOL 300 MG/ML  SOLN
INTRAMUSCULAR | Status: DC | PRN
  Administered 2014-10-01: 10 mL via URETHRAL

## 2014-10-01 MED ORDER — LIDOCAINE HCL (CARDIAC) 20 MG/ML IV SOLN
INTRAVENOUS | Status: DC | PRN
  Administered 2014-10-01: 100 mg via INTRAVENOUS

## 2014-10-01 SURGICAL SUPPLY — 20 items
BAG URO CATCHER STRL LF (DRAPE) ×3 IMPLANT
CATH INTERMIT  6FR 70CM (CATHETERS) ×3 IMPLANT
CLOTH BEACON ORANGE TIMEOUT ST (SAFETY) ×3 IMPLANT
EXTRACTOR STONE NITINOL NGAGE (UROLOGICAL SUPPLIES) ×3 IMPLANT
FIBER LASER FLEXIVA 1000 (UROLOGICAL SUPPLIES) IMPLANT
FIBER LASER FLEXIVA 200 (UROLOGICAL SUPPLIES) ×3 IMPLANT
FIBER LASER FLEXIVA 365 (UROLOGICAL SUPPLIES) IMPLANT
FIBER LASER FLEXIVA 550 (UROLOGICAL SUPPLIES) IMPLANT
FIBER LASER TRAC TIP (UROLOGICAL SUPPLIES) IMPLANT
GLOVE BIOGEL M STRL SZ7.5 (GLOVE) ×9 IMPLANT
GOWN STRL REUS W/TWL LRG LVL3 (GOWN DISPOSABLE) ×6 IMPLANT
GUIDEWIRE ANG ZIPWIRE 038X150 (WIRE) IMPLANT
GUIDEWIRE STR DUAL SENSOR (WIRE) ×3 IMPLANT
MANIFOLD NEPTUNE II (INSTRUMENTS) ×3 IMPLANT
PACK CYSTO (CUSTOM PROCEDURE TRAY) ×3 IMPLANT
SHEATH ACCESS URETERAL 38CM (SHEATH) ×3 IMPLANT
SHIELD EYE BINOCULAR (MISCELLANEOUS) IMPLANT
STENT CONTOUR 6FRX26X.038 (STENTS) ×3 IMPLANT
TUBING CONNECTING 10 (TUBING) ×2 IMPLANT
TUBING CONNECTING 10' (TUBING) ×1

## 2014-10-01 NOTE — Transfer of Care (Signed)
Immediate Anesthesia Transfer of Care Note  Patient: Craig Booth  Procedure(s) Performed: Procedure(s): CYSTOSCOPY WITH RETROGRADE PYELOGRAM, URETEROSCOPY, STONE REMOVAL, AND STENT PLACEMENT (Right) HOLMIUM LASER APPLICATION (Right)  Patient Location: PACU  Anesthesia Type:General  Level of Consciousness: awake, alert , oriented and patient cooperative  Airway & Oxygen Therapy: Patient Spontanous Breathing and Patient connected to face mask oxygen  Post-op Assessment: Report given to RN, Post -op Vital signs reviewed and stable and Patient moving all extremities  Post vital signs: Reviewed and stable  Last Vitals:  Filed Vitals:   10/01/14 1812  BP: 160/85  Pulse: 62  Temp: 36.4 C  Resp: 16    Complications: No apparent anesthesia complications

## 2014-10-01 NOTE — Anesthesia Postprocedure Evaluation (Signed)
  Anesthesia Post-op Note  Patient: Craig Booth  Procedure(s) Performed: Procedure(s) (LRB): CYSTOSCOPY WITH RETROGRADE PYELOGRAM, URETEROSCOPY, STONE REMOVAL, AND STENT PLACEMENT (Right) HOLMIUM LASER APPLICATION (Right)  Patient Location: PACU  Anesthesia Type: General  Level of Consciousness: awake and alert   Airway and Oxygen Therapy: Patient Spontanous Breathing  Post-op Pain: mild  Post-op Assessment: Post-op Vital signs reviewed, Patient's Cardiovascular Status Stable, Respiratory Function Stable, Patent Airway and No signs of Nausea or vomiting  Last Vitals:  Filed Vitals:   10/01/14 2030  BP:   Pulse:   Temp: 36.4 C  Resp:     Post-op Vital Signs: stable   Complications: No apparent anesthesia complications. Oxygen saturations 94 to 97% RA with deep breathing. Denies dyspnes. Incentive Spirometer given with instructions.

## 2014-10-01 NOTE — Op Note (Signed)
Preoperative diagnosis: Right ureteral calculus  Postoperative diagnosis: Right ureteral calculus  Procedure:  1. Cystoscopy 2. Right ureteroscopy and stone removal 3. Ureteroscopic laser lithotripsy 4. Right ureteral stent placement (6 x 26 with string) 5. Right retrograde pyelography with interpretation  Surgeon: Pryor Curia. M.D.  Anesthesia: General  Complications: None  Intraoperative findings: Right retrograde pyelography demonstrated a filling defect within the proximal right ureter just above the iliac vessels consistent with the patient's known calculus without other abnormalities noted.  EBL: Minimal  Specimens: 1. Right ureteral calculus  Disposition of specimens: [facility] for stone analysis  Indication: Craig Booth  is a 64 y.o. patient with urolithiasis who presents with acute pain related to a persistent right ureteral stone. After reviewing the management options for treatment, they elected to proceed with the above surgical procedure(s). We have discussed the potential benefits and risks of the procedure, side effects of the proposed treatment, the likelihood of the patient achieving the goals of the procedure, and any potential problems that might occur during the procedure or recuperation. Informed consent has been obtained.  Description of procedure:  The patient was taken to the operating room and general anesthesia was induced.  The patient was placed in the dorsal lithotomy position, prepped and draped in the usual sterile fashion, and preoperative antibiotics were administered. A preoperative time-out was performed.   Cystourethroscopy was performed.  The patient's urethra was examined and was normal. The bladder was then systematically examined in its entirety. There was no evidence for any bladder tumors, stones, or other mucosal pathology.    Attention then turned to the right ureteral orifice and a ureteral catheter was  used to intubate the ureteral orifice.  Omnipaque contrast was injected through the ureteral catheter and a retrograde pyelogram was performed with findings as dictated above.  A 0.38 sensor guidewire was then advanced up the right ureter into the renal pelvis under fluoroscopic guidance. The 4.5 Fr Needle ureteroscope was passed up the ureter but the stone was unable to be reached.  A 12/14 Fr ureteral access sheath was then advanced over the guide wire. The digital flexible ureteroscope was then advanced through the access sheath into the ureter next to the guidewire and the calculus was identified and was located in the proximal ureter.   The stone was then fragmented with the 200 micron holmium laser fiber on a setting of 0.6 J and frequency of 6 Hz.   All sizable stones were then removed with an N gauge basket.  Reinspection of the ureter/renal pelvis revealed no remaining visible stones or fragments of significant size.   The safety wire was then replaced and the access sheath removed.  The guidewire was backloaded through the cystoscope and a ureteral stent was advance over the wire using Seldinger technique.  The stent was positioned appropriately under fluoroscopic and cystoscopic guidance.  The wire was then removed with an adequate stent curl noted in the renal pelvis as well as in the bladder.  The bladder was then emptied and the procedure ended.  The patient appeared to tolerate the procedure well and without complications.  The patient was able to be awakened and transferred to the recovery unit in satisfactory condition.   Pryor Curia MD

## 2014-10-01 NOTE — Anesthesia Procedure Notes (Signed)
Procedure Name: Intubation Date/Time: 10/01/2014 7:27 PM Performed by: Carleene Cooper A Pre-anesthesia Checklist: Patient identified, Emergency Drugs available, Patient being monitored, Timeout performed and Suction available Patient Re-evaluated:Patient Re-evaluated prior to inductionOxygen Delivery Method: Circle system utilized Preoxygenation: Pre-oxygenation with 100% oxygen Intubation Type: Cricoid Pressure applied, Rapid sequence and IV induction Grade View: Grade I Tube type: Oral (by Dr. Delma Post) Tube size: 7.5 mm Airway Equipment and Method: Stylet Placement Confirmation: ETT inserted through vocal cords under direct vision and positive ETCO2 Tube secured with: Tape Dental Injury: Teeth and Oropharynx as per pre-operative assessment

## 2014-10-01 NOTE — H&P (Signed)
History of Present Illness [name] is a 64 year old gentleman with a history of prostate cancer status post brachytherapy and a history of kidney stones followed by Dr. Jeffie Pollock. He developed the severe onset of right flank pain with radiation to his right lower quadrant and right testis at approximately 1 PM today associated with severe nausea and vomiting. He was seen by his primary care physician, Dr. Shelia Media, and was found to have microscopic hematuria. He did have a CT scan back in February that demonstrated a 2 mm proximal ureteral calculus without other stones identified. He did not have significant hydronephrosis. It was presumed that he had passed the stone as he had been a symptomatically states that he never actually visibly saw the stone pass. He denies any fever.   Past Medical History Problems  1. History of Calculus of ureter (N20.1) 2. History of Elevated prostate specific antigen (PSA) (R97.2) 3. History of Gross hematuria (R31.0) 4. History of hypercholesterolemia (Z86.39) 5. History of hypertension (Z86.79) 6. History of Venous Thrombosis Of The Deep Vessels Of The Lower Extremity  Surgical History Problems  1. History of Surgery Prostate Transperineal Placement Of Needles 2. History of Tonsillectomy 3. History of Umbilical Hernia Repair  Current Meds 1. CefTRIAXone Sodium 1 GM Injection Solution Reconstituted; INJECT 1  GM  Intramuscular; To Be Done: [id number]; Status: HOLD FOR - Administration Ordered 2. Losartan Potassium-HCTZ 100-25 MG Oral Tablet;  Therapy: [id number] to Recorded 3. Magnesium TABS;  Therapy: (Recorded:17Oct2013) to Recorded 4. Multi-Vitamin Oral Tablet;  Therapy: (Recorded:17Oct2013) to Recorded 5. Sildenafil Citrate 20 MG Oral Tablet; take  2   -   5  tablets as needed and  instructed;  Therapy: 02Sep2015 to (Last Rx:02Sep2015) Ordered 6. Vitamin C TABS;  Therapy: (Recorded:17Oct2013) to Recorded 7. Warfarin Sodium 5 MG Oral Tablet;  Therapy: (Recorded:17Oct2013) to Recorded  Allergies Medication  1. No Known Drug Allergies  Family History Problems  1. Family history of Alzheimer's Disease : Father 2. Family history of Brain Tumor : Father 3. Family history of Death In The Family Father : Father 43. Family history of Family Health Status - Mother's Age   age 66 5. Family history of Family Health Status Number Of Children   2 sons 41. Family history of Multiple Sclerosis : Mother 21. Family history of Nephrolithiasis : Father 41. Family history of Recurrent nephrolithiasis : Father  Social History Problems  1. Denied: History of Alcohol Use 2. Denied: History of Caffeine Use 3. Marital History - Currently Married 4. Never A Smoker 5. 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  Review of Systems  Genitourinary: no hematuria.  Gastrointestinal: nausea and vomiting.  Constitutional: no fever.    Vitals Vital Signs [Data Includes: Last 1 Day]  Recorded: 20Apr2016 04:11PM  Blood Pressure: 160 / 93 Heart Rate: 64  Physical Exam Constitutional: Well nourished and well developed . No acute distress.  ENT:. The ears and nose are normal in appearance.  Neck: The appearance of the neck is normal and no neck mass is present.  Pulmonary: No respiratory distress and normal respiratory rhythm and effort.  Cardiovascular: Heart rate and rhythm are normal . No peripheral edema.  Abdomen: The abdomen is no rebound and no guarding. Severe tenderness in the RLQ is present. severe right CVA tenderness.  Genitourinary: Examination of the penis demonstrates no discharge, no masses, no lesions and a normal meatus. The scrotum is without lesions. The right epididymis is palpably normal and non-tender. The left  epididymis is palpably normal and non-tender. The right testis is tender, but without masses. The left testis is non-tender and without masses.  Lymphatics: The femoral and inguinal nodes are not enlarged or  tender.  Skin: Normal skin turgor, no visible rash and no visible skin lesions.  Neuro/Psych:. Mood and affect are appropriate.    Results/Data I reviewed his outside medical records.    I independently reviewed his KUB x-ray today. This does not demonstrate any obvious calcifications over the expected course of the ureters or renal shadows bilaterally.    I independently reviewed his right renal ultrasound. The right kidney measures 11.7 x 6.8 x 6.6 cm. There is some mild fullness of the renal pelvis without frank hydronephrosis. He does have a large simple-appearing cyst of the right lower pole measuring 7 x 5.8 cm. There is a smaller simple cyst in the interpolar region measuring 2.0 x 1.7 cm. There is a questionable echogenic focus in the renal pelvis measuring 0.9 cm although this may be a vascular calcification. Bladder PVR is 45.3 cc. Bilateral ureteral jets are noted.    Due to the fact that no convincing evidence of obvious urinary tract obstruction was noted on his KUB or ultrasound, I did recommend we proceed with a CT stone protocol study.     I independently reviewed his stone protocol CT scan. This does demonstrate a 2-3 mm stone in the proximal/mid right ureter with associated hydronephrosis.     Assessment Assessed  1. Ureteral stone (N20.1)  Plan Ureteral stone  1. Administered: Ketorolac Tromethamine 60 MG/2ML Injection Solution 2. AU CT-STONE PROTOCOL; Status:In Progress - Specimen/Data Collected;   Done:  20Apr2016 05:24PM 3. KUB; Status:Resulted - Requires Verification;   Done: 20Apr2016 04:41PM 4. RENAL U/S RIGHT; Status:Resulted - Requires Verification;   Done: 20Apr2016 05:05PM  Discussion/Summary 1. Right ureteral stone: We have discussed options including medical expulsion therapy versus definitive surgical therapy. Considering that his stone has been present since February, he understands that I'm not very optimistic that he will pass the stone  spontaneously. Furthermore, he has been extremely nauseated and in severe pain today. Ultimately he has elected to proceed with surgical therapy and we reviewed options. Considering his anticoagulation, I recommended ureteroscopic laser lithotripsy as his best option. We have reviewed the potential risks of this procedure including but not limited to bleeding, infection, injury or damage to the urinary tract, possible need for additional procedures, possible stricture formation, etc. We discussed the probable need for a postoperative ureteral stent. He gives his informed consent to proceed. This will be performed this evening.    Cc: Dr. Irine Seal  Dr. Deland Pretty     Signatures Electronically signed by : Raynelle Bring, M.D.; Oct 01 2014  5:45PM EST

## 2014-10-01 NOTE — Anesthesia Preprocedure Evaluation (Addendum)
Anesthesia Evaluation  Patient identified by MRN, date of birth, ID band Patient awake    Reviewed: Allergy & Precautions, NPO status , Patient's Chart, lab work & pertinent test results  History of Anesthesia Complications (+) AWARENESS UNDER ANESTHESIA  Airway Mallampati: II  TM Distance: >3 FB Neck ROM: Full    Dental no notable dental hx.    Pulmonary neg pulmonary ROS,  breath sounds clear to auscultation  Pulmonary exam normal       Cardiovascular Exercise Tolerance: Good hypertension, Pt. on medications + dysrhythmias Rhythm:Regular Rate:Normal     Neuro/Psych negative neurological ROS  negative psych ROS   GI/Hepatic negative GI ROS, Neg liver ROS,   Endo/Other  negative endocrine ROS  Renal/GU negative Renal ROS  negative genitourinary   Musculoskeletal negative musculoskeletal ROS (+)   Abdominal   Peds negative pediatric ROS (+)  Hematology Factor V leiden . H/O DVT   Anesthesia Other Findings   Reproductive/Obstetrics negative OB ROS                            Anesthesia Physical Anesthesia Plan  ASA: II and emergent  Anesthesia Plan: General   Post-op Pain Management:    Induction: Intravenous  Airway Management Planned: Oral ETT  Additional Equipment:   Intra-op Plan:   Post-operative Plan: Extubation in OR  Informed Consent: I have reviewed the patients History and Physical, chart, labs and discussed the procedure including the risks, benefits and alternatives for the proposed anesthesia with the patient or authorized representative who has indicated his/her understanding and acceptance.   Dental advisory given  Plan Discussed with: CRNA  Anesthesia Plan Comments:         Anesthesia Quick Evaluation

## 2014-10-01 NOTE — Discharge Instructions (Signed)
1. You may see some blood in the urine and may have some burning with urination for 48-72 hours. You also may notice that you have to urinate more frequently or urgently after your procedure which is normal.  2. You should call should you develop an inability urinate, fever > 101, persistent nausea and vomiting that prevents you from eating or drinking to stay hydrated.  3. If you have a stent, you will likely urinate more frequently and urgently until the stent is removed and you may experience some discomfort/pain in the lower abdomen and flank especially when urinating. You may take pain medication prescribed to you if needed for pain. You may also intermittently have blood in the urine until the stent is removed. 4. You may remove your stent on Monday morning.  Simply pull the string coming out the urethra (pee channel) while in the shower.  Some urine may come out with the stent.  If you experience any pain, it is ok to take pain medication as needed.  Any pain should be transient and resolve.

## 2014-10-02 ENCOUNTER — Encounter (HOSPITAL_COMMUNITY): Payer: Self-pay | Admitting: Urology

## 2016-07-21 DIAGNOSIS — Z86718 Personal history of other venous thrombosis and embolism: Secondary | ICD-10-CM | POA: Diagnosis not present

## 2016-07-21 DIAGNOSIS — Z7901 Long term (current) use of anticoagulants: Secondary | ICD-10-CM | POA: Diagnosis not present

## 2016-09-08 DIAGNOSIS — Z86718 Personal history of other venous thrombosis and embolism: Secondary | ICD-10-CM | POA: Diagnosis not present

## 2016-09-08 DIAGNOSIS — Z7901 Long term (current) use of anticoagulants: Secondary | ICD-10-CM | POA: Diagnosis not present

## 2016-10-27 DIAGNOSIS — Z86718 Personal history of other venous thrombosis and embolism: Secondary | ICD-10-CM | POA: Diagnosis not present

## 2016-10-27 DIAGNOSIS — Z7901 Long term (current) use of anticoagulants: Secondary | ICD-10-CM | POA: Diagnosis not present

## 2016-12-08 DIAGNOSIS — Z86718 Personal history of other venous thrombosis and embolism: Secondary | ICD-10-CM | POA: Diagnosis not present

## 2016-12-08 DIAGNOSIS — Z7901 Long term (current) use of anticoagulants: Secondary | ICD-10-CM | POA: Diagnosis not present

## 2017-01-19 DIAGNOSIS — Z86718 Personal history of other venous thrombosis and embolism: Secondary | ICD-10-CM | POA: Diagnosis not present

## 2017-01-19 DIAGNOSIS — Z7901 Long term (current) use of anticoagulants: Secondary | ICD-10-CM | POA: Diagnosis not present

## 2017-03-02 DIAGNOSIS — Z7901 Long term (current) use of anticoagulants: Secondary | ICD-10-CM | POA: Diagnosis not present

## 2017-03-02 DIAGNOSIS — Z86718 Personal history of other venous thrombosis and embolism: Secondary | ICD-10-CM | POA: Diagnosis not present

## 2017-03-02 DIAGNOSIS — I10 Essential (primary) hypertension: Secondary | ICD-10-CM | POA: Diagnosis not present

## 2017-03-07 DIAGNOSIS — Z125 Encounter for screening for malignant neoplasm of prostate: Secondary | ICD-10-CM | POA: Diagnosis not present

## 2017-03-07 DIAGNOSIS — I10 Essential (primary) hypertension: Secondary | ICD-10-CM | POA: Diagnosis not present

## 2017-03-07 DIAGNOSIS — E7800 Pure hypercholesterolemia, unspecified: Secondary | ICD-10-CM | POA: Diagnosis not present

## 2017-03-10 DIAGNOSIS — I10 Essential (primary) hypertension: Secondary | ICD-10-CM | POA: Diagnosis not present

## 2017-03-10 DIAGNOSIS — E7800 Pure hypercholesterolemia, unspecified: Secondary | ICD-10-CM | POA: Diagnosis not present

## 2017-03-10 DIAGNOSIS — R7309 Other abnormal glucose: Secondary | ICD-10-CM | POA: Diagnosis not present

## 2017-03-10 DIAGNOSIS — N529 Male erectile dysfunction, unspecified: Secondary | ICD-10-CM | POA: Diagnosis not present

## 2017-04-13 DIAGNOSIS — Z7901 Long term (current) use of anticoagulants: Secondary | ICD-10-CM | POA: Diagnosis not present

## 2017-04-13 DIAGNOSIS — I10 Essential (primary) hypertension: Secondary | ICD-10-CM | POA: Diagnosis not present

## 2017-04-13 DIAGNOSIS — E7800 Pure hypercholesterolemia, unspecified: Secondary | ICD-10-CM | POA: Diagnosis not present

## 2017-04-13 DIAGNOSIS — Z86718 Personal history of other venous thrombosis and embolism: Secondary | ICD-10-CM | POA: Diagnosis not present

## 2017-04-19 DIAGNOSIS — Z8546 Personal history of malignant neoplasm of prostate: Secondary | ICD-10-CM | POA: Diagnosis not present

## 2017-04-26 DIAGNOSIS — Z87442 Personal history of urinary calculi: Secondary | ICD-10-CM | POA: Diagnosis not present

## 2017-04-26 DIAGNOSIS — Z8546 Personal history of malignant neoplasm of prostate: Secondary | ICD-10-CM | POA: Diagnosis not present

## 2017-04-26 DIAGNOSIS — N5201 Erectile dysfunction due to arterial insufficiency: Secondary | ICD-10-CM | POA: Diagnosis not present

## 2017-05-10 DIAGNOSIS — I10 Essential (primary) hypertension: Secondary | ICD-10-CM | POA: Diagnosis not present

## 2017-07-07 DIAGNOSIS — J019 Acute sinusitis, unspecified: Secondary | ICD-10-CM | POA: Diagnosis not present

## 2017-07-07 DIAGNOSIS — H66002 Acute suppurative otitis media without spontaneous rupture of ear drum, left ear: Secondary | ICD-10-CM | POA: Diagnosis not present

## 2017-07-13 DIAGNOSIS — Z86718 Personal history of other venous thrombosis and embolism: Secondary | ICD-10-CM | POA: Diagnosis not present

## 2017-07-13 DIAGNOSIS — Z7901 Long term (current) use of anticoagulants: Secondary | ICD-10-CM | POA: Diagnosis not present

## 2017-07-14 DIAGNOSIS — H02413 Mechanical ptosis of bilateral eyelids: Secondary | ICD-10-CM | POA: Diagnosis not present

## 2017-07-18 ENCOUNTER — Other Ambulatory Visit (HOSPITAL_COMMUNITY): Payer: Self-pay | Admitting: Oculoplastics Ophthalmology

## 2017-07-18 DIAGNOSIS — H4922 Sixth [abducent] nerve palsy, left eye: Secondary | ICD-10-CM

## 2017-07-20 ENCOUNTER — Ambulatory Visit (HOSPITAL_COMMUNITY): Payer: Medicare Other

## 2017-07-20 ENCOUNTER — Ambulatory Visit (HOSPITAL_COMMUNITY)
Admission: RE | Admit: 2017-07-20 | Discharge: 2017-07-20 | Disposition: A | Discharge status: Home / Self Care | Payer: Medicare Other | Source: Ambulatory Visit | Attending: Oculoplastics Ophthalmology | Admitting: Oculoplastics Ophthalmology

## 2017-07-20 DIAGNOSIS — H532 Diplopia: Secondary | ICD-10-CM | POA: Diagnosis not present

## 2017-07-20 DIAGNOSIS — H4922 Sixth [abducent] nerve palsy, left eye: Secondary | ICD-10-CM

## 2017-08-07 DIAGNOSIS — H02412 Mechanical ptosis of left eyelid: Secondary | ICD-10-CM | POA: Diagnosis not present

## 2017-08-07 DIAGNOSIS — H02413 Mechanical ptosis of bilateral eyelids: Secondary | ICD-10-CM | POA: Diagnosis not present

## 2017-08-07 DIAGNOSIS — H02411 Mechanical ptosis of right eyelid: Secondary | ICD-10-CM | POA: Diagnosis not present

## 2017-08-11 DIAGNOSIS — Z86718 Personal history of other venous thrombosis and embolism: Secondary | ICD-10-CM | POA: Diagnosis not present

## 2017-08-11 DIAGNOSIS — Z7901 Long term (current) use of anticoagulants: Secondary | ICD-10-CM | POA: Diagnosis not present

## 2017-08-18 DIAGNOSIS — Z7901 Long term (current) use of anticoagulants: Secondary | ICD-10-CM | POA: Diagnosis not present

## 2017-08-18 DIAGNOSIS — Z86718 Personal history of other venous thrombosis and embolism: Secondary | ICD-10-CM | POA: Diagnosis not present

## 2017-09-08 DIAGNOSIS — Z7901 Long term (current) use of anticoagulants: Secondary | ICD-10-CM | POA: Diagnosis not present

## 2017-09-08 DIAGNOSIS — Z86718 Personal history of other venous thrombosis and embolism: Secondary | ICD-10-CM | POA: Diagnosis not present

## 2017-09-18 DIAGNOSIS — I10 Essential (primary) hypertension: Secondary | ICD-10-CM | POA: Diagnosis not present

## 2017-11-03 DIAGNOSIS — H6120 Impacted cerumen, unspecified ear: Secondary | ICD-10-CM | POA: Diagnosis not present

## 2017-11-03 DIAGNOSIS — I10 Essential (primary) hypertension: Secondary | ICD-10-CM | POA: Diagnosis not present

## 2017-11-07 DIAGNOSIS — Z86718 Personal history of other venous thrombosis and embolism: Secondary | ICD-10-CM | POA: Diagnosis not present

## 2017-11-07 DIAGNOSIS — Z7901 Long term (current) use of anticoagulants: Secondary | ICD-10-CM | POA: Diagnosis not present

## 2017-11-07 DIAGNOSIS — E7800 Pure hypercholesterolemia, unspecified: Secondary | ICD-10-CM | POA: Diagnosis not present

## 2017-12-18 DIAGNOSIS — E7800 Pure hypercholesterolemia, unspecified: Secondary | ICD-10-CM | POA: Diagnosis not present

## 2017-12-18 DIAGNOSIS — Z1159 Encounter for screening for other viral diseases: Secondary | ICD-10-CM | POA: Diagnosis not present

## 2017-12-20 DIAGNOSIS — Z7901 Long term (current) use of anticoagulants: Secondary | ICD-10-CM | POA: Diagnosis not present

## 2017-12-20 DIAGNOSIS — E7800 Pure hypercholesterolemia, unspecified: Secondary | ICD-10-CM | POA: Diagnosis not present

## 2017-12-20 DIAGNOSIS — Z86718 Personal history of other venous thrombosis and embolism: Secondary | ICD-10-CM | POA: Diagnosis not present

## 2017-12-21 DIAGNOSIS — I10 Essential (primary) hypertension: Secondary | ICD-10-CM | POA: Diagnosis not present

## 2017-12-21 DIAGNOSIS — N529 Male erectile dysfunction, unspecified: Secondary | ICD-10-CM | POA: Diagnosis not present

## 2017-12-21 DIAGNOSIS — M545 Low back pain: Secondary | ICD-10-CM | POA: Diagnosis not present

## 2018-01-10 DIAGNOSIS — Z7901 Long term (current) use of anticoagulants: Secondary | ICD-10-CM | POA: Diagnosis not present

## 2018-01-10 DIAGNOSIS — E7800 Pure hypercholesterolemia, unspecified: Secondary | ICD-10-CM | POA: Diagnosis not present

## 2018-01-10 DIAGNOSIS — Z86718 Personal history of other venous thrombosis and embolism: Secondary | ICD-10-CM | POA: Diagnosis not present

## 2018-01-10 DIAGNOSIS — I10 Essential (primary) hypertension: Secondary | ICD-10-CM | POA: Diagnosis not present

## 2018-01-24 DIAGNOSIS — S92512A Displaced fracture of proximal phalanx of left lesser toe(s), initial encounter for closed fracture: Secondary | ICD-10-CM | POA: Diagnosis not present

## 2018-01-24 DIAGNOSIS — T148XXA Other injury of unspecified body region, initial encounter: Secondary | ICD-10-CM | POA: Diagnosis not present

## 2018-01-24 DIAGNOSIS — M79672 Pain in left foot: Secondary | ICD-10-CM | POA: Diagnosis not present

## 2018-01-25 DIAGNOSIS — S92535A Nondisplaced fracture of distal phalanx of left lesser toe(s), initial encounter for closed fracture: Secondary | ICD-10-CM | POA: Diagnosis not present

## 2018-01-25 DIAGNOSIS — S92515A Nondisplaced fracture of proximal phalanx of left lesser toe(s), initial encounter for closed fracture: Secondary | ICD-10-CM | POA: Diagnosis not present

## 2018-01-25 DIAGNOSIS — M79672 Pain in left foot: Secondary | ICD-10-CM | POA: Diagnosis not present

## 2018-01-30 DIAGNOSIS — Z7901 Long term (current) use of anticoagulants: Secondary | ICD-10-CM | POA: Diagnosis not present

## 2018-01-30 DIAGNOSIS — Z86718 Personal history of other venous thrombosis and embolism: Secondary | ICD-10-CM | POA: Diagnosis not present

## 2018-03-06 DIAGNOSIS — M79672 Pain in left foot: Secondary | ICD-10-CM | POA: Diagnosis not present

## 2018-03-14 DIAGNOSIS — Z7901 Long term (current) use of anticoagulants: Secondary | ICD-10-CM | POA: Diagnosis not present

## 2018-03-14 DIAGNOSIS — Z86718 Personal history of other venous thrombosis and embolism: Secondary | ICD-10-CM | POA: Diagnosis not present

## 2018-04-11 DIAGNOSIS — Z86718 Personal history of other venous thrombosis and embolism: Secondary | ICD-10-CM | POA: Diagnosis not present

## 2018-04-11 DIAGNOSIS — Z7901 Long term (current) use of anticoagulants: Secondary | ICD-10-CM | POA: Diagnosis not present

## 2018-04-30 DIAGNOSIS — Z8546 Personal history of malignant neoplasm of prostate: Secondary | ICD-10-CM | POA: Diagnosis not present

## 2018-05-09 ENCOUNTER — Other Ambulatory Visit: Payer: Self-pay | Admitting: Urology

## 2018-05-09 DIAGNOSIS — C6211 Malignant neoplasm of descended right testis: Secondary | ICD-10-CM | POA: Diagnosis not present

## 2018-05-09 DIAGNOSIS — Z8546 Personal history of malignant neoplasm of prostate: Secondary | ICD-10-CM | POA: Diagnosis not present

## 2018-05-09 DIAGNOSIS — Z7901 Long term (current) use of anticoagulants: Secondary | ICD-10-CM | POA: Diagnosis not present

## 2018-05-09 DIAGNOSIS — N50811 Right testicular pain: Secondary | ICD-10-CM | POA: Diagnosis not present

## 2018-05-09 DIAGNOSIS — Z86718 Personal history of other venous thrombosis and embolism: Secondary | ICD-10-CM | POA: Diagnosis not present

## 2018-05-15 ENCOUNTER — Encounter (HOSPITAL_COMMUNITY): Payer: Self-pay | Admitting: *Deleted

## 2018-05-15 DIAGNOSIS — I10 Essential (primary) hypertension: Secondary | ICD-10-CM | POA: Diagnosis not present

## 2018-05-15 DIAGNOSIS — N5089 Other specified disorders of the male genital organs: Secondary | ICD-10-CM | POA: Diagnosis not present

## 2018-05-15 DIAGNOSIS — Z8546 Personal history of malignant neoplasm of prostate: Secondary | ICD-10-CM | POA: Diagnosis not present

## 2018-05-16 NOTE — Patient Instructions (Addendum)
Craig Booth  05/16/2018   Your procedure is scheduled on: 05-22-18  Report to [facility] Main  Entrance  Report to admitting at     0530  AM    Call this number if you have problems the morning of surgery [phone]    Remember: Do not eat food or drink liquids :After Midnight.   BRUSH YOUR TEETH MORNING OF SURGERY AND RINSE YOUR MOUTH OUT, NO CHEWING GUM CANDY OR MINTS.     Take these medicines the morning of surgery with A SIP OF WATER: lipitor                                 You may not have any metal on your body including hair pins and              piercings  Do not wear jewelry, , lotions, powders or perfumes, deodorant                     Men may shave face and neck.   Do not bring valuables to the hospital. [place].  Contacts, dentures or bridgework may not be worn into surgery.      Patients discharged the day of surgery will not be allowed to drive home.  Name and phone number of your driver:  Special Instructions: N/A              Please read over the following fact sheets you were given: _____________________________________________________________________          [facility] - Preparing for Surgery Before surgery, you can play an important role.  Because skin is not sterile, your skin needs to be as free of germs as possible.  You can reduce the number of germs on your skin by washing with CHG (chlorahexidine gluconate) soap before surgery.  CHG is an antiseptic cleaner which kills germs and bonds with the skin to continue killing germs even after washing. Please DO NOT use if you have an allergy to CHG or antibacterial soaps.  If your skin becomes reddened/irritated stop using the CHG and inform your nurse when you arrive at Short Stay. Do not shave (including legs and underarms) for at least 48 hours prior to the first CHG shower.  You may shave your face/neck. Please follow these  instructions carefully:  1.  Shower with CHG Soap the night before surgery and the  morning of Surgery.  2.  If you choose to wash your hair, wash your hair first as usual with your  normal  shampoo.  3.  After you shampoo, rinse your hair and body thoroughly to remove the  shampoo.                           4.  Use CHG as you would any other liquid soap.  You can apply chg directly  to the skin and wash                       Gently with a scrungie or clean washcloth.  5.  Apply the CHG Soap to your body ONLY FROM THE NECK DOWN.   Do not  use on face/ open                           Wound or open sores. Avoid contact with eyes, ears mouth and genitals (private parts).                       Wash face,  Genitals (private parts) with your normal soap.             6.  Wash thoroughly, paying special attention to the area where your surgery  will be performed.  7.  Thoroughly rinse your body with warm water from the neck down.  8.  DO NOT shower/wash with your normal soap after using and rinsing off  the CHG Soap.                9.  Pat yourself dry with a clean towel.            10.  Wear clean pajamas.            11.  Place clean sheets on your bed the night of your first shower and do not  sleep with pets. Day of Surgery : Do not apply any lotions/deodorants the morning of surgery.  Please wear clean clothes to the hospital/surgery center.  FAILURE TO FOLLOW THESE INSTRUCTIONS MAY RESULT IN THE CANCELLATION OF YOUR SURGERY PATIENT SIGNATURE_________________________________  NURSE SIGNATURE__________________________________  ________________________________________________________________________

## 2018-05-17 ENCOUNTER — Encounter (HOSPITAL_COMMUNITY)
Admission: RE | Admit: 2018-05-17 | Discharge: 2018-05-17 | Disposition: A | Discharge status: Home / Self Care | Payer: Medicare Other | Source: Ambulatory Visit | Attending: Urology | Admitting: Urology

## 2018-05-17 ENCOUNTER — Encounter (HOSPITAL_COMMUNITY): Payer: Self-pay

## 2018-05-17 ENCOUNTER — Other Ambulatory Visit: Payer: Self-pay

## 2018-05-17 DIAGNOSIS — N5089 Other specified disorders of the male genital organs: Secondary | ICD-10-CM | POA: Insufficient documentation

## 2018-05-17 DIAGNOSIS — Z01818 Encounter for other preprocedural examination: Secondary | ICD-10-CM | POA: Diagnosis not present

## 2018-05-17 HISTORY — DX: Personal history of urinary calculi: Z87.442

## 2018-05-17 HISTORY — DX: Pneumonia, unspecified organism: J18.9

## 2018-05-17 NOTE — Progress Notes (Signed)
Pam scheduler at [facility] made aware pt. Complained of sore throat at preop and headache . Pt. Has an appt with PCP Dr. Shelia Media 05-18-18 the patient. Had no fever at preop.

## 2018-05-18 DIAGNOSIS — J0100 Acute maxillary sinusitis, unspecified: Secondary | ICD-10-CM | POA: Diagnosis not present

## 2018-05-18 NOTE — Pre-Procedure Instructions (Signed)
Last office visit note 05/15/2018 Dr. Shelia Media in chart.

## 2018-05-18 NOTE — Pre-Procedure Instructions (Signed)
Surgical clearance Dr. Shelia Media in chart

## 2018-05-21 NOTE — Anesthesia Preprocedure Evaluation (Addendum)
Anesthesia Evaluation  Patient identified by MRN, date of birth, ID band Patient awake    Reviewed: Allergy & Precautions, NPO status , Patient's Chart, lab work & pertinent test results  Airway Mallampati: I  TM Distance: >3 FB Neck ROM: Full    Dental no notable dental hx. (+) Teeth Intact, Dental Advisory Given   Pulmonary neg pulmonary ROS,    Pulmonary exam normal breath sounds clear to auscultation       Cardiovascular hypertension, Pt. on medications + DVT  Normal cardiovascular exam Rhythm:Regular Rate:Normal     Neuro/Psych negative neurological ROS  negative psych ROS   GI/Hepatic negative GI ROS, Neg liver ROS,   Endo/Other  negative endocrine ROS  Renal/GU negative Renal ROS  negative genitourinary   Musculoskeletal negative musculoskeletal ROS (+)   Abdominal   Peds  Hematology negative hematology ROS (+) Factor V Leiden on coumadin, last dose 05/17/18   Anesthesia Other Findings Right testicular mass  Reproductive/Obstetrics                            Anesthesia Physical Anesthesia Plan  ASA: III  Anesthesia Plan: General   Post-op Pain Management:    Induction: Intravenous  PONV Risk Score and Plan: 2 and Ondansetron, Dexamethasone and Midazolam  Airway Management Planned: LMA and Oral ETT  Additional Equipment:   Intra-op Plan:   Post-operative Plan: Extubation in OR  Informed Consent: I have reviewed the patients History and Physical, chart, labs and discussed the procedure including the risks, benefits and alternatives for the proposed anesthesia with the patient or authorized representative who has indicated his/her understanding and acceptance.   Dental advisory given  Plan Discussed with: CRNA  Anesthesia Plan Comments:         Anesthesia Quick Evaluation

## 2018-05-21 NOTE — H&P (Signed)
CC: I have prostate cancer.  HPI: Craig Booth is a 67 year-old male established patient who is here evaluation for treatment of prostate cancer.  His prostate cancer was diagnosed approximately 02/11/2013. His most recent PSA is 0.065.   He is s/p a seed implant done on 10/29/12 for T2a Nx Mx Gleason 6 prostate cancer. His PSA down to 0.065 from 0.1 last year. He is voiding well but he has had a couple of episodes of insensible post void dribbling with large volumes over the last couple of months. He was previously on daily cialis which had helped that problem. He has had no hematuria or dysuria. .   He has had progressive ED and the oral meds no longer work.   He has a history of stones and had a right ureteroscopy for a 57mm stone in April 2017 that was mixed Calcium oxalate on analysis. He has had no hematuria or flank pain.   He remains on warfarin.       ALLERGIES: Amoxicillin    MEDICATIONS: Hydrochlorothiazide 25 mg tablet  Atorvastatin Calcium 10 mg tablet  Magnesium TABS Oral  Multi-Vitamin Oral Tablet Oral  Sildenafil Citrate 20 mg tablet take 1-5 tablets po prn as directed  Telmisartan-Amlodipine 80 mg-5 mg tablet  Vitamin C TABS Oral  Warfarin Sodium 5 MG Oral Tablet Oral     GU PSH: Cysto Uretero Lithotripsy - 2016 Cystoscopy Insert Stent - 2016 Rpr Umbil Hern; Reduc < 5 Yr - 2013 TRANSPERI NEEDLE PLACE, PROS - 2014      [place] Notes: Cystoscopy With Ureteroscopy With Lithotripsy, Cystoscopy With Insertion Of Ureteral Stent Right, Surgery Prostate Transperineal Placement Of Needles, Tonsillectomy, Umbilical Hernia Repair   NON-GU PSH: Remove Tonsils - 2013    GU PMH: History of prostate cancer (Improving), He is doing well with a further decline in the PSA. He will return in a year with a PSA. - 04/26/2017, (Improving), His PSA is down nicely. , - 2017 History of urolithiasis, He has no worrisome symptoms and a clear urine. - 04/26/2017 ED due to arterial  insufficiency, Erectile dysfunction due to arterial insufficiency - 2016 Prostate Cancer, Prostate cancer - 2016 Ureteral calculus, Ureteral stone - 2016, Calculus of ureter, - 2016 Urinary Urgency, Urinary urgency - 2016 Gross hematuria, Gross hematuria - 2016 Renal cyst, Renal cysts, acquired, bilateral - 2016 Elevated PSA, Elevated prostate specific antigen (PSA) - 2014 Prostate nodule w/o LUTS, Nodular prostate without lower urinary tract symptoms - 2014      PMH Notes:  2012-03-29 14:55:53 - Note: Venous Thrombosis Of The Deep Vessels Of The Lower Extremity   NON-GU PMH: Encounter for general adult medical examination without abnormal findings, Encounter for preventive health examination - 2016 Personal history of other specified conditions, History of right flank pain - 2016 Activated protein C resistance, Factor V Leiden mutation - 2014 Personal history of other diseases of the circulatory system, History of hypertension - 2014 Personal history of other endocrine, nutritional and metabolic disease, History of hypercholesterolemia - 2014    FAMILY HISTORY: Alzheimer's Disease - Father Brain tumor - Father Death In The Family Father - Runs In Family Family Health Status - Mother's Age - Runs In Family Family Health Status Number - [place] In Family multiple sclerosis - Mother nephrolithiasis - Father Recurrent nephrolithiasis - Runs In Family   SOCIAL HISTORY: Marital Status: Married Preferred Language: English; Ethnicity: Not Hispanic Or Latino; Race: White     Notes: Occupation:, Never A Smoker, Alcohol Use,  Caffeine Use, Marital History - Currently Married   REVIEW OF SYSTEMS:    GU Review Male:   Patient denies frequent urination, hard to postpone urination, burning/ pain with urination, get up at night to urinate, leakage of urine, stream starts and stops, trouble starting your stream, have to strain to urinate , erection problems, and penile pain.  Gastrointestinal  (Upper):   Patient denies nausea, vomiting, and indigestion/ heartburn.  Gastrointestinal (Lower):   Patient denies diarrhea and constipation.  Constitutional:   Patient denies fever, night sweats, weight loss, and fatigue.  Skin:   in the right crotch. Better when allowed to dry and he uses a jock itch med. He has noted that his right testicle feels hard in spots. Patient reports itching. Patient denies skin rash/ lesion.  Eyes:   Patient reports double vision. Patient denies blurred vision.  Ears/ Nose/ Throat:   Patient reports sinus problems. Patient denies sore throat.  Hematologic/Lymphatic:   Patient denies swollen glands and easy bruising.  Cardiovascular:   Patient denies leg swelling and chest pains.  Respiratory:   Patient denies cough and shortness of breath.  Endocrine:   Patient denies excessive thirst.  Musculoskeletal:   Patient denies joint pain and back pain.  Neurological:   Patient denies headaches and dizziness.  Psychologic:   Patient denies depression and anxiety.   VITAL SIGNS:      05/09/2018 01:22 PM  Weight 249.6 lb / 113.22 kg  Height 75 in / 190.5 cm  BP 108/70 mmHg  Pulse 65 /min  Temperature 97.4 F / 36.3 C  BMI 31.2 kg/m   GU PHYSICAL EXAMINATION:    Anus and Perineum: No hemorrhoids. No anal stenosis. No rectal fissure, no anal fissure. No edema, no dimple, no perineal tenderness, no anal tenderness.  Scrotum: No lesions. No edema. No cysts. No warts.  Epididymides: Left: left head tender, left body tender. Right: No spermatocele, no masses, no cysts, no tenderness, no induration, no enlargement. Left: No spermatocele, no masses, no cysts, no induration, no enlargement.   Testes: mildly Tender right testis, enlarged with a Solid mass effect in the right testis. No tenderness, no swelling, no enlargement left testis. No swelling right testis. Normal location left testis. Normal location right testis. No mass, no cyst, no varicocele, no hydrocele left  testis. No cyst, no varicocele, no hydrocele right testis.   Urethral Meatus: Normal size. No lesion, no wart, no discharge, no polyp. Normal location.  Penis: Circumcised, no warts, no cracks. No dorsal Peyronie's plaques, no left corporal Peyronie's plaques, no right corporal Peyronie's plaques, no scarring, no warts. No balanitis, no meatal stenosis.  Prostate: Prostate 1 + size. Left lobe firm, right lobe firm. Symmetrical lobes. No prostate nodule. Left lobe no tenderness, right lobe no tenderness.   Seminal Vesicles: Nonpalpable.  Sphincter Tone: Normal sphincter. No rectal tenderness. No rectal mass.    MULTI-SYSTEM PHYSICAL EXAMINATION:    Constitutional: Well-nourished. No physical deformities. Normally developed. Good grooming.  Respiratory: Normal breath sounds. No labored breathing, no use of accessory muscles.   Cardiovascular: Regular rate and rhythm. No murmur, no gallop.   Lymphatic: No enlargement, no tenderness of inguinal or femoral lymph nodes.  Gastrointestinal: No hernia. no tenderness, no rigidity, non obese abdomen.      PAST DATA REVIEWED:  Source Of History:  Patient  Urine Test Review:   Urinalysis  X-Ray Review: Scrotal Ultrasound: Reviewed Films. Discussed With Patient. right testicular mass.     04/30/18 04/19/17  02/26/16 03/19/15 09/09/14 04/18/14 10/18/13 04/01/13  PSA  Total PSA 0.065 ng/mL 0.10 ng/mL 0.25 ng/dl 0.67  0.57  1.00  1.34  1.94     PROCEDURES:         Scrotal Ultrasound - [id number]  Right Testicle: Length: 5.2 cm  Height: 2.8 cm  Width: 3.7 cm  Left Testicle: Length: 4.9 cm  Height: 2.6 cm  Width: 3.2 cm  Left Testis/Epididymis:  Varicocele noted. ----- Small amount of fluid noted.  Right Testis/Epididymis:  A large portion of the right testicle appears to be inhomogeneous with blood flow noted. There is an estimated measurement of 4.9 cm x 2.2 cm x 3.4 cm. ---- Right epi is not distinctly visualized. ----- Small amount of fluid noted.           ASSESSMENT:      ICD-10 Details  1 GU:   History of prostate cancer - Z85.46 Improving - His PSA has fallen further.   2   Right testicular pain - N50.811 He has a right testicular mass that is worrisome for a neoplasm on exam and [place]. I will get markers and get him set up for a right radical orchiectomy. I discussed the risks of bleeding, infection, nerve injury, pain, thrombotic events and anesthetic complications. I discussed a prosthesis but he is probably not going to be interested. I will get him cleared to come off of warfarin.   3   Right testicular cancer - C62.11    PLAN:           Orders Labs CBC with Diff, CMP, Beta HCG, Alpha Fetoprotein (AFP)  X-Rays: Scrotal Ultrasound - Mass in the right testicle.           Schedule Return Visit/Planned Activity: Next Available Appointment - Schedule Surgery

## 2018-05-22 ENCOUNTER — Ambulatory Visit (HOSPITAL_COMMUNITY): Payer: Medicare Other | Admitting: Anesthesiology

## 2018-05-22 ENCOUNTER — Encounter (HOSPITAL_COMMUNITY)
Admission: RE | Disposition: A | Discharge status: Home / Self Care | Payer: Self-pay | Source: Home / Self Care | Attending: Urology

## 2018-05-22 ENCOUNTER — Ambulatory Visit (HOSPITAL_COMMUNITY)
Admission: RE | Admit: 2018-05-22 | Discharge: 2018-05-22 | Disposition: A | Discharge status: Home / Self Care | Payer: Medicare Other | Attending: Urology | Admitting: Urology

## 2018-05-22 ENCOUNTER — Encounter (HOSPITAL_COMMUNITY): Payer: Self-pay | Admitting: *Deleted

## 2018-05-22 DIAGNOSIS — Z79899 Other long term (current) drug therapy: Secondary | ICD-10-CM | POA: Diagnosis not present

## 2018-05-22 DIAGNOSIS — D4011 Neoplasm of uncertain behavior of right testis: Secondary | ICD-10-CM | POA: Diagnosis not present

## 2018-05-22 DIAGNOSIS — N529 Male erectile dysfunction, unspecified: Secondary | ICD-10-CM | POA: Diagnosis not present

## 2018-05-22 DIAGNOSIS — E7800 Pure hypercholesterolemia, unspecified: Secondary | ICD-10-CM | POA: Insufficient documentation

## 2018-05-22 DIAGNOSIS — N5089 Other specified disorders of the male genital organs: Secondary | ICD-10-CM | POA: Diagnosis not present

## 2018-05-22 DIAGNOSIS — I10 Essential (primary) hypertension: Secondary | ICD-10-CM | POA: Insufficient documentation

## 2018-05-22 DIAGNOSIS — D6851 Activated protein C resistance: Secondary | ICD-10-CM | POA: Diagnosis not present

## 2018-05-22 DIAGNOSIS — Z880 Allergy status to penicillin: Secondary | ICD-10-CM | POA: Diagnosis not present

## 2018-05-22 DIAGNOSIS — C6291 Malignant neoplasm of right testis, unspecified whether descended or undescended: Secondary | ICD-10-CM | POA: Insufficient documentation

## 2018-05-22 DIAGNOSIS — R229 Localized swelling, mass and lump, unspecified: Secondary | ICD-10-CM | POA: Diagnosis present

## 2018-05-22 DIAGNOSIS — Z7901 Long term (current) use of anticoagulants: Secondary | ICD-10-CM | POA: Insufficient documentation

## 2018-05-22 DIAGNOSIS — Z8546 Personal history of malignant neoplasm of prostate: Secondary | ICD-10-CM | POA: Diagnosis not present

## 2018-05-22 DIAGNOSIS — Z86718 Personal history of other venous thrombosis and embolism: Secondary | ICD-10-CM | POA: Diagnosis not present

## 2018-05-22 HISTORY — PX: ORCHIECTOMY: SHX2116

## 2018-05-22 SURGERY — ORCHIECTOMY
Anesthesia: General | Site: Scrotum | Laterality: Right

## 2018-05-22 MED ORDER — PROPOFOL 10 MG/ML IV BOLUS
INTRAVENOUS | Status: DC | PRN
  Administered 2018-05-22: 200 mg via INTRAVENOUS

## 2018-05-22 MED ORDER — ONDANSETRON HCL 4 MG/2ML IJ SOLN
INTRAMUSCULAR | Status: AC
  Filled 2018-05-22: qty 2

## 2018-05-22 MED ORDER — MORPHINE SULFATE (PF) 4 MG/ML IV SOLN: 2.0000 mg | INTRAVENOUS | Status: DC | PRN

## 2018-05-22 MED ORDER — MIDAZOLAM HCL 5 MG/5ML IJ SOLN
INTRAMUSCULAR | Status: DC | PRN
  Administered 2018-05-22: 2 mg via INTRAVENOUS

## 2018-05-22 MED ORDER — FENTANYL CITRATE (PF) 100 MCG/2ML IJ SOLN
INTRAMUSCULAR | Status: DC | PRN
  Administered 2018-05-22 (×3): 50 ug via INTRAVENOUS

## 2018-05-22 MED ORDER — BUPIVACAINE HCL 0.25 % IJ SOLN
INTRAMUSCULAR | Status: DC | PRN
  Administered 2018-05-22: 9 mL

## 2018-05-22 MED ORDER — PROPOFOL 10 MG/ML IV BOLUS
INTRAVENOUS | Status: AC
  Filled 2018-05-22: qty 20

## 2018-05-22 MED ORDER — FENTANYL CITRATE (PF) 100 MCG/2ML IJ SOLN
INTRAMUSCULAR | Status: AC
  Filled 2018-05-22: qty 2

## 2018-05-22 MED ORDER — LIDOCAINE 2% (20 MG/ML) 5 ML SYRINGE
INTRAMUSCULAR | Status: DC | PRN
  Administered 2018-05-22: 100 mg via INTRAVENOUS

## 2018-05-22 MED ORDER — PHENYLEPHRINE 40 MCG/ML (10ML) SYRINGE FOR IV PUSH (FOR BLOOD PRESSURE SUPPORT)
PREFILLED_SYRINGE | INTRAVENOUS | Status: AC
  Filled 2018-05-22: qty 10

## 2018-05-22 MED ORDER — HYDROCODONE-ACETAMINOPHEN 5-325 MG PO TABS: 1.0000 | ORAL_TABLET | Freq: Four times a day (QID) | ORAL | 0 refills | Status: DC | PRN

## 2018-05-22 MED ORDER — PHENYLEPHRINE HCL 10 MG/ML IJ SOLN
INTRAVENOUS | Status: DC | PRN
  Administered 2018-05-22: 25 ug/min via INTRAVENOUS

## 2018-05-22 MED ORDER — ACETAMINOPHEN 650 MG RE SUPP
650.0000 mg | RECTAL | Status: DC | PRN
  Filled 2018-05-22: qty 1

## 2018-05-22 MED ORDER — PHENYLEPHRINE 40 MCG/ML (10ML) SYRINGE FOR IV PUSH (FOR BLOOD PRESSURE SUPPORT)
PREFILLED_SYRINGE | INTRAVENOUS | Status: DC | PRN
  Administered 2018-05-22 (×3): 80 ug via INTRAVENOUS

## 2018-05-22 MED ORDER — DEXAMETHASONE SODIUM PHOSPHATE 10 MG/ML IJ SOLN
INTRAMUSCULAR | Status: AC
  Filled 2018-05-22: qty 1

## 2018-05-22 MED ORDER — LACTATED RINGERS IV SOLN
INTRAVENOUS | Status: DC
  Administered 2018-05-22: 06:00:00 via INTRAVENOUS

## 2018-05-22 MED ORDER — DEXAMETHASONE SODIUM PHOSPHATE 10 MG/ML IJ SOLN
INTRAMUSCULAR | Status: DC | PRN
  Administered 2018-05-22: 10 mg via INTRAVENOUS

## 2018-05-22 MED ORDER — HYDROMORPHONE HCL 2 MG PO TABS: 2.0000 mg | ORAL_TABLET | ORAL | 0 refills | Status: DC | PRN

## 2018-05-22 MED ORDER — SODIUM CHLORIDE 0.9% FLUSH: 3.0000 mL | INTRAVENOUS | Status: DC | PRN

## 2018-05-22 MED ORDER — CIPROFLOXACIN IN D5W 400 MG/200ML IV SOLN
400.0000 mg | Freq: Once | INTRAVENOUS | Status: AC
  Administered 2018-05-22: 400 mg via INTRAVENOUS
  Filled 2018-05-22: qty 200

## 2018-05-22 MED ORDER — BUPIVACAINE HCL 0.25 % IJ SOLN
INTRAMUSCULAR | Status: AC
  Filled 2018-05-22: qty 1

## 2018-05-22 MED ORDER — OXYCODONE HCL 5 MG PO TABS: 5.0000 mg | ORAL_TABLET | ORAL | Status: DC | PRN

## 2018-05-22 MED ORDER — ACETAMINOPHEN 325 MG PO TABS: 650.0000 mg | ORAL_TABLET | ORAL | Status: DC | PRN

## 2018-05-22 MED ORDER — PHENYLEPHRINE HCL 10 MG/ML IJ SOLN
INTRAMUSCULAR | Status: AC
  Filled 2018-05-22: qty 1

## 2018-05-22 MED ORDER — SODIUM CHLORIDE 0.9% FLUSH: 3.0000 mL | Freq: Two times a day (BID) | INTRAVENOUS | Status: DC

## 2018-05-22 MED ORDER — ONDANSETRON HCL 4 MG/2ML IJ SOLN
INTRAMUSCULAR | Status: DC | PRN
  Administered 2018-05-22: 4 mg via INTRAVENOUS

## 2018-05-22 MED ORDER — LIDOCAINE 2% (20 MG/ML) 5 ML SYRINGE
INTRAMUSCULAR | Status: AC
  Filled 2018-05-22: qty 5

## 2018-05-22 MED ORDER — FENTANYL CITRATE (PF) 100 MCG/2ML IJ SOLN: 25.0000 ug | INTRAMUSCULAR | Status: DC | PRN

## 2018-05-22 MED ORDER — SODIUM CHLORIDE 0.9 % IV SOLN: 250.0000 mL | INTRAVENOUS | Status: DC | PRN

## 2018-05-22 MED ORDER — MIDAZOLAM HCL 2 MG/2ML IJ SOLN
INTRAMUSCULAR | Status: AC
  Filled 2018-05-22: qty 2

## 2018-05-22 MED ORDER — 0.9 % SODIUM CHLORIDE (POUR BTL) OPTIME
TOPICAL | Status: DC | PRN
  Administered 2018-05-22: 1000 mL

## 2018-05-22 SURGICAL SUPPLY — 30 items
BENZOIN TINCTURE PRP APPL 2/3 (GAUZE/BANDAGES/DRESSINGS) ×3 IMPLANT
BLADE HEX COATED 2.75 (ELECTRODE) ×3 IMPLANT
BNDG GAUZE ELAST 4 BULKY (GAUZE/BANDAGES/DRESSINGS) ×3 IMPLANT
CATH URET 5FR 28IN OPEN ENDED (CATHETERS) IMPLANT
COVER WAND RF STERILE (DRAPES) ×3 IMPLANT
DECANTER SPIKE VIAL GLASS SM (MISCELLANEOUS) ×3 IMPLANT
DERMABOND ADVANCED (GAUZE/BANDAGES/DRESSINGS) ×2
DERMABOND ADVANCED .7 DNX12 (GAUZE/BANDAGES/DRESSINGS) ×1 IMPLANT
DRAIN PENROSE 18X1/2 LTX STRL (DRAIN) ×3 IMPLANT
DRAIN PENROSE 18X1/4 LTX STRL (WOUND CARE) ×3 IMPLANT
ELECT REM PT RETURN 15FT ADLT (MISCELLANEOUS) ×3 IMPLANT
GAUZE SPONGE 4X4 12PLY STRL (GAUZE/BANDAGES/DRESSINGS) ×3 IMPLANT
GLOVE SURG SS PI 8.0 STRL IVOR (GLOVE) ×3 IMPLANT
GOWN STRL REUS W/TWL XL LVL3 (GOWN DISPOSABLE) ×3 IMPLANT
KIT BASIN OR (CUSTOM PROCEDURE TRAY) ×3 IMPLANT
NEEDLE HYPO 22GX1.5 SAFETY (NEEDLE) ×3 IMPLANT
NS IRRIG 1000ML POUR BTL (IV SOLUTION) IMPLANT
PACK GENERAL/GYN (CUSTOM PROCEDURE TRAY) ×3 IMPLANT
SPONGE LAP 4X18 RFD (DISPOSABLE) ×6 IMPLANT
SUPPORT SCROTAL LG STRP (MISCELLANEOUS) ×2 IMPLANT
SUPPORTER ATHLETIC LG (MISCELLANEOUS) ×1
SUT CHROMIC 3 0 SH 27 (SUTURE) ×6 IMPLANT
SUT CHROMIC 4 0 SH 27 (SUTURE) IMPLANT
SUT MNCRL AB 4-0 PS2 18 (SUTURE) ×3 IMPLANT
SUT VIC AB 3-0 SH 27 (SUTURE) ×2
SUT VIC AB 3-0 SH 27XBRD (SUTURE) ×1 IMPLANT
SUT VICRYL 0 TIES 12 18 (SUTURE) ×3 IMPLANT
SYR CONTROL 10ML LL (SYRINGE) ×3 IMPLANT
TRAY PREP A LATEX SAFE STRL (SET/KITS/TRAYS/PACK) ×3 IMPLANT
WATER STERILE IRR 1000ML POUR (IV SOLUTION) IMPLANT

## 2018-05-22 NOTE — Transfer of Care (Signed)
Immediate Anesthesia Transfer of Care Note  Patient: Craig Booth  Procedure(s) Performed: ORCHIECTOMY, RADICAL (Right Scrotum)  Patient Location: PACU  Anesthesia Type:General  Level of Consciousness: awake, alert  and oriented  Airway & Oxygen Therapy: Patient Spontanous Breathing and Patient connected to face mask oxygen  Post-op Assessment: Report given to RN and Post -op Vital signs reviewed and stable  Post vital signs: Reviewed and stable  Last Vitals:  Vitals Value Taken Time  BP 149/101 05/22/2018  8:42 AM  Temp 36.4 C 05/22/2018  8:42 AM  Pulse 68 05/22/2018  8:43 AM  Resp    SpO2 92 % 05/22/2018  8:43 AM  Vitals shown include unvalidated device data.  Last Pain:  Vitals:   05/22/18 0542  TempSrc: Oral         Complications: No apparent anesthesia complications

## 2018-05-22 NOTE — Discharge Instructions (Addendum)
Orchiectomy, Care After This sheet gives you information about how to care for yourself after your procedure. Your health care provider may also give you more specific instructions. If you have problems or questions, contact your health care provider. What can I expect after the procedure? After the procedure, it is common to have:  Pain.  Bruising.  Blood pooling (hematoma) in the area where your testicles were removed.  Depression or mood changes.  Fatigue.  Hot flashes.  Follow these instructions at home: Managing pain and swelling  If directed, put ice on the affected area: ? Put ice in a plastic bag. ? Place a towel between your skin and the bag. ? Leave the ice on for 20 minutes, 2-3 times a day.  Wear scrotal support as told by your health care provider.  To relieve pressure and pain when sitting, you may use a donut cushion if directed by your health care provider. Incision care  Follow instructions from your health care provider about how to take care of your incision. Make sure you: ? Wash your hands with soap and water before you change your bandage (dressing). If soap and water are not available, use hand sanitizer. ? Change your dressing once a day, or as often as told by your health care provider. If the dressing sticks to your incision area: ? Use warm, soapy water or hydrogen peroxide to dampen the dressing. ? When the dressing becomes loose, lift it from the incision area. Make sure that the incision stays closed. ? Leave stitches (sutures), skin glue, or adhesive strips in place. These skin closures may need to stay in place for 2 weeks or longer. If adhesive strip edges start to loosen and curl up, you may trim the loose edges. Do not remove adhesive strips completely unless your health care provider tells you to do that.  Keep your dressing dry until it has been removed.  Check your incision area every day for signs of infection. Check for: ? More redness,  swelling, or pain. ? More fluid or blood. ? Warmth. ? Pus or a bad smell. Bathing  Do not take baths, swim, or use a hot tub until your health care provider approves. You may start taking showers two days after your procedure.  Do not rub your incision to dry it. Pat the area gently with a clean cloth or let it air-dry. Medicines  Take over-the-counter and prescription medicines only as told by your health care provider.  If you were prescribed an antibiotic medicine, use it as told by your health care provider. Do not stop using the antibiotic even if you start to feel better.  If you had both testicles removed, talk with your health care provider about medicine supplements to replace one of the male hormones (testosterone) that your body will no longer make. Driving  Do not drive for 24 hours if you were given a medicine to help you relax (sedative).  Do not drive or use heavy machinery while taking prescription pain medicines. Activity  Avoid activities that may cause your incision to open, such as jogging, playing sports, and straining with bowel movements. Ask your health care provider what activities are safe for you.  Do not lift anything that is heavier than 10 lb (4.5 kg), or the limit that your health care provider tells you, until he or she says that it is safe.  Do not engage in sexual activity until the area is healed and your health care provider approves.  This could take up to 4 weeks. General instructions  To prevent or treat constipation while you are taking prescription pain medicine, your health care provider may recommend that you: ? Drink enough fluid to keep your urine clear or pale yellow. ? Take over-the-counter or prescription medicines. ? Eat foods that are high in fiber, such as fresh fruits and vegetables, whole grains, and beans. ? Limit foods that are high in fat and processed sugars, such as fried and sweet foods.  Do not use any products that  contain nicotine or tobacco, such as cigarettes and e-cigarettes. If you need help quitting, ask your health care provider.  Keep all follow-up visits as told by your health care provider. This is important. Contact a health care provider if:  You have more pain, swelling, or redness in your genital or groin area.  You have more fluid or blood coming from your incision.  Your incision feels warm to the touch.  You have pus or a bad smell coming from your incision.  You have constipation that is not helped by changing your diet or drinking more fluid.  You develop nausea or vomiting.  You cannot eat or drink without vomiting. Get help right away if:  You have dizziness or nausea that does not go away.  You have trouble breathing.  You have a wet (congested) cough.  You have a fever or shaking chills.  Your incision breaks open after the skin closures have been removed.  You are not able to urinate. Summary  After this procedure, it is most common to have bruising or blood pooling in the area where the testicles were removed.  You should check your incision area every day for signs of infection, such as redness, swelling, pain, fluid, blood, warmth, pus, or a bad smell.  Avoid activities that may cause your incision to open, such as jogging, playing sports, and straining with bowel movements. Ask your health care provider what activities are safe for you.  You should not engage in sexual activity until the area is healed and your health care provider approves. This could take up to 4 weeks.  Men who have both testicles removed may have emotional and physical side effects. Your health care provider can help you with ways to manage those side effects.  You may resume warfarin and lovenox tomorrow morning. .  This information is not intended to replace advice given to you by your health care provider. Make sure you discuss any questions you have with your health care  provider. Document Released: 01/30/2013 Document Revised: 04/21/2016 Document Reviewed: 04/21/2016 Elsevier Interactive Patient Education  2017 [employer].

## 2018-05-22 NOTE — Anesthesia Procedure Notes (Signed)
Procedure Name: LMA Insertion Date/Time: 05/22/2018 7:36 AM Performed by: Maxwell Caul, CRNA Pre-anesthesia Checklist: Patient identified, Emergency Drugs available, Suction available and Patient being monitored Patient Re-evaluated:Patient Re-evaluated prior to induction Oxygen Delivery Method: Circle system utilized Preoxygenation: Pre-oxygenation with 100% oxygen Induction Type: IV induction LMA: LMA inserted LMA Size: 4.0 Number of attempts: 1 Placement Confirmation: positive ETCO2 and breath sounds checked- equal and bilateral Tube secured with: Tape Dental Injury: Teeth and Oropharynx as per pre-operative assessment

## 2018-05-22 NOTE — Interval H&P Note (Signed)
History and Physical Interval Note:  He has had some further growth of the mass.    05/22/2018 7:15 AM  Craig Booth  has presented today for surgery, with the diagnosis of RIGHT TESTICULAR MASS  The various methods of treatment have been discussed with the patient and family. After consideration of risks, benefits and other options for treatment, the patient has consented to  Procedure(s): ORCHIECTOMY, RADICAL (Right) as a surgical intervention .  The patient's history has been reviewed, patient examined, no change in status, stable for surgery.  I have reviewed the patient's chart and labs.  Questions were answered to the patient's satisfaction.     Irine Seal

## 2018-05-22 NOTE — Anesthesia Postprocedure Evaluation (Addendum)
Anesthesia Post Note  Patient: Craig Booth  Procedure(s) Performed: ORCHIECTOMY, RADICAL (Right Scrotum)     Patient location during evaluation: PACU Anesthesia Type: General Level of consciousness: awake and alert Pain management: pain level controlled Vital Signs Assessment: post-procedure vital signs reviewed and stable Respiratory status: spontaneous breathing, nonlabored ventilation, respiratory function stable and patient connected to nasal cannula oxygen Cardiovascular status: blood pressure returned to baseline and stable Postop Assessment: no apparent nausea or vomiting Anesthetic complications: no    Last Vitals:  Vitals:   05/22/18 0930 05/22/18 0940  BP: 134/82 106/88  Pulse: 64 62  Resp:    Temp:  36.4 C  SpO2: 100% 94%    Last Pain:  Vitals:   05/22/18 0940  TempSrc:   PainSc: 2                  Faizah Kandler L Berda Shelvin

## 2018-05-22 NOTE — Op Note (Signed)
Procedure: Right radical orchiectomy.  Preop diagnosis: Right testicular mass.  Postop diagnosis: Same.  Surgeon: Dr. Irine Seal.  Anesthesia: General.  Specimen: Right testicle and cord.  Drain: None.  EBL: Minimal.  Complications: None.  Indications: Craig Booth is a 67 year old white male who was recently found to have a right testicular mass that was suspicious for neoplasm.  He is to undergo a right radical orchiectomy today.  Procedure: He was given Cipro for antibiotic prophylaxis.  He was taken operating room where a general anesthetic was induced with him in the supine position.  PAS hose were placed.  His right inguinal area and scrotum were clipped.  He was prepped with Betadine solution and draped in usual sterile fashion.  An incision was made with a knife along the skin lines over the right inguinal canal.  The subcutaneous tissue was incised with the Bovie, exposing the external oblique fascia over the cord.  The fascia was opened along its fibers with a knife at the level of the internal ring and then opened to the external ring.  Despite inspection the ilioinguinal nerve was not identified.  The cord was then dissected out from the canal and 1/4 inch Penrose drain was doubly wrapped around the cord and secured for initial vascular control.  The testicle was then delivered into the wound within the tunica vaginalis.  The gubernacular attachments were taken down.  The cord was then dissected away from an adherent lipoma and divided into 2 packets and clamped with hemostats.  The testicle was removed and passed off the field.  The cord packets were then doubly ligated with 0 Vicryl ties.  Once hemostasis was assured, the external oblique fascia was closed using a running 3-0 Vicryl suture.  The subcutaneous tissue was reapproximated with interrupted 3-0 chromic sutures.  The wound was injected with 9 mL of quarter percent Marcaine.  The skin was closed with a intracuticular 4-0 Monocryl  suture and after the wound was cleaned and dried, the incision was reinforced with Dermabond.  A dressing of 4 x 4's, fluff Kerlix and scrotal support was applied.  His anesthetic was reversed and he was taken to recovery room in stable condition.  There were no complications.

## 2018-05-23 ENCOUNTER — Encounter (HOSPITAL_COMMUNITY): Payer: Self-pay | Admitting: Urology

## 2018-05-23 DIAGNOSIS — D225 Melanocytic nevi of trunk: Secondary | ICD-10-CM | POA: Diagnosis not present

## 2018-05-23 DIAGNOSIS — D485 Neoplasm of uncertain behavior of skin: Secondary | ICD-10-CM | POA: Diagnosis not present

## 2018-05-23 DIAGNOSIS — R208 Other disturbances of skin sensation: Secondary | ICD-10-CM | POA: Diagnosis not present

## 2018-05-23 DIAGNOSIS — Z1283 Encounter for screening for malignant neoplasm of skin: Secondary | ICD-10-CM | POA: Diagnosis not present

## 2018-05-25 DIAGNOSIS — C6211 Malignant neoplasm of descended right testis: Secondary | ICD-10-CM | POA: Diagnosis not present

## 2018-05-28 ENCOUNTER — Other Ambulatory Visit: Payer: Self-pay | Admitting: Urology

## 2018-05-28 ENCOUNTER — Ambulatory Visit (HOSPITAL_COMMUNITY)
Admission: RE | Admit: 2018-05-28 | Discharge: 2018-05-28 | Disposition: A | Discharge status: Home / Self Care | Payer: Medicare Other | Source: Ambulatory Visit | Attending: Urology | Admitting: Urology

## 2018-05-28 DIAGNOSIS — Z9089 Acquired absence of other organs: Secondary | ICD-10-CM | POA: Diagnosis not present

## 2018-05-28 DIAGNOSIS — C6211 Malignant neoplasm of descended right testis: Secondary | ICD-10-CM

## 2018-05-28 DIAGNOSIS — I517 Cardiomegaly: Secondary | ICD-10-CM | POA: Diagnosis not present

## 2018-05-28 DIAGNOSIS — N281 Cyst of kidney, acquired: Secondary | ICD-10-CM | POA: Diagnosis not present

## 2018-05-28 DIAGNOSIS — C6291 Malignant neoplasm of right testis, unspecified whether descended or undescended: Secondary | ICD-10-CM | POA: Diagnosis not present

## 2018-05-31 ENCOUNTER — Ambulatory Visit (HOSPITAL_COMMUNITY)
Admission: RE | Admit: 2018-05-31 | Discharge: 2018-05-31 | Disposition: A | Discharge status: Home / Self Care | Payer: Medicare Other | Source: Ambulatory Visit | Attending: Urology | Admitting: Urology

## 2018-05-31 ENCOUNTER — Other Ambulatory Visit: Payer: Self-pay | Admitting: Urology

## 2018-05-31 DIAGNOSIS — J9811 Atelectasis: Secondary | ICD-10-CM | POA: Diagnosis not present

## 2018-05-31 DIAGNOSIS — C6211 Malignant neoplasm of descended right testis: Secondary | ICD-10-CM

## 2018-06-04 DIAGNOSIS — Z86718 Personal history of other venous thrombosis and embolism: Secondary | ICD-10-CM | POA: Diagnosis not present

## 2018-06-04 DIAGNOSIS — I10 Essential (primary) hypertension: Secondary | ICD-10-CM | POA: Diagnosis not present

## 2018-06-04 DIAGNOSIS — Z7901 Long term (current) use of anticoagulants: Secondary | ICD-10-CM | POA: Diagnosis not present

## 2018-06-08 ENCOUNTER — Telehealth: Payer: Self-pay | Admitting: Oncology

## 2018-06-08 ENCOUNTER — Encounter: Payer: Self-pay | Admitting: Oncology

## 2018-06-08 NOTE — Telephone Encounter (Signed)
New referral received from Dr. Jeffie Pollock at [facility] urology for testicular cancer. Pt has been scheduled to see Dr. Alen Blew on 1/9 at 11am. Unable to reach the pt. Lft a vm for him to cb. Letter mailed to the pt.

## 2018-06-20 DIAGNOSIS — D485 Neoplasm of uncertain behavior of skin: Secondary | ICD-10-CM | POA: Diagnosis not present

## 2018-06-20 DIAGNOSIS — L988 Other specified disorders of the skin and subcutaneous tissue: Secondary | ICD-10-CM | POA: Diagnosis not present

## 2018-06-21 ENCOUNTER — Inpatient Hospital Stay: Payer: Medicare Other | Attending: Oncology | Admitting: Oncology

## 2018-06-21 VITALS — BP 115/76 | HR 78 | Temp 97.7°F | Resp 18 | Ht 75.0 in | Wt 250.0 lb

## 2018-06-21 DIAGNOSIS — C6291 Malignant neoplasm of right testis, unspecified whether descended or undescended: Secondary | ICD-10-CM | POA: Diagnosis not present

## 2018-06-21 DIAGNOSIS — C61 Malignant neoplasm of prostate: Secondary | ICD-10-CM

## 2018-06-21 NOTE — Progress Notes (Signed)
Reason for the request: Testicular cancer     HPI: I was asked by Dr. Jeffie Pollock  to evaluate Mr. Bergeson for new diagnosis of testicular cancer.  He is a 68 year old man with prostate cancer diagnosed in 2014.  He presented with a PSA of 3.8 and a Gleason score of 3+3 = 6.  He was treated with seed implants under the care of Dr. Jeffie Pollock and Dr. Tammi Klippel in May 2014.  His PSA remains very low at this time.  He presented with right testicular discomfort and an ultrasound obtained in December 2019 showed testicular mass that is suspicious for neoplasm.  He underwent a right radical orchiectomy on May 22, 2018 without any complications.  The final pathology showed a 3.8 cm spermatocytic tumor without any lymphovascular invasion.  The final pathological staging was T1b and ask.  A CT scan of the abdomen and pelvis showed no evidence of metastatic disease or adenopathy.  Chest x-ray on 05/31/2018 also showed no evidence of disease.  His last PSA was 0.065 in November 2019, alpha-fetoprotein in November 2019 was 2.0 beta-hCG less than 1.  Clinically, he reports no complaints at this time and is fully recovered from his operation.  He does not report any headaches, blurry vision, syncope or seizures. Does not report any fevers, chills or sweats.  Does not report any cough, wheezing or hemoptysis.  Does not report any chest pain, palpitation, orthopnea or leg edema.  Does not report any nausea, vomiting or abdominal pain.  Does not report any constipation or diarrhea.  Does not report any skeletal complaints.    Does not report frequency, urgency or hematuria.  Does not report any skin rashes or lesions. Does not report any heat or cold intolerance.  Does not report any lymphadenopathy or petechiae.  Does not report any anxiety or depression.  Remaining review of systems is negative.    Past Medical History:  Diagnosis Date  . Anticoagulated on Coumadin   . Auditory vertigo involving left ear VIRAL EAR INFECTION  IMPROVING  . Compressed vertebrae    YRS AGO--  LUMBAR  . Factor V Leiden mutation ([place])    MONTIORED BY DR [place]  . History of basal cell carcinoma excision   . History of DVT of lower extremity RIGHT /NOV 2011   and right groin  . History of kidney stones   . History of phlebitis NOV 2011  . Hypercholesterolemia    improved with diet, not taking medication  . Hypertension   . Incomplete RBBB   . Organic impotence   . Pneumonia   . Prostate cancer ([place]) 06/25/2012   Adenocarcinoma,gleason=3+3=6,PSA=3.8,volume=44cc  :  Past Surgical History:  Procedure Laterality Date  . COLONOSCOPY  2012  . CYSTOSCOPY N/A 11/08/2012   Procedure: CYSTOSCOPY FLEXIBLE;  Surgeon: Malka So, MD;  Location: [facility];  Service: Urology;  Laterality: N/A;  NO SEEDS FOUND IN BLADDER  . CYSTOSCOPY WITH RETROGRADE PYELOGRAM, URETEROSCOPY AND STENT PLACEMENT Right 10/01/2014   Procedure: CYSTOSCOPY WITH RETROGRADE PYELOGRAM, URETEROSCOPY, STONE REMOVAL, AND STENT PLACEMENT;  Surgeon: Raynelle Bring, MD;  Location: [facility];  Service: Urology;  Laterality: Right;  . HOLMIUM LASER APPLICATION Right 2/64/1583   Procedure: HOLMIUM LASER APPLICATION;  Surgeon: Raynelle Bring, MD;  Location: [facility];  Service: Urology;  Laterality: Right;  . ORCHIECTOMY Right 05/22/2018   Procedure: ORCHIECTOMY, RADICAL;  Surgeon: Irine Seal, MD;  Location: [facility];  Service: Urology;  Laterality: Right;  . PROSTATE  BIOPSY  06/25/2012   Adenocarcinoma  . RADIOACTIVE SEED IMPLANT N/A 11/08/2012   Procedure:  31 RADIOACTIVE SEED IMPLANT;  Surgeon: Malka So, MD;  [facility]  Service: Urology;  Laterality: N/A;      SEEDS IMPLANTED  . TONSILLECTOMY  AGE 74  . UMBILICAL HERNIA REPAIR  02/10/2011  :   Current Outpatient Medications:  .  atorvastatin (LIPITOR) 10 MG tablet, Take 10 mg by mouth daily., Disp: , Rfl:  .  enoxaparin (LOVENOX) 120 MG/0.8ML injection, Inject 110 mg  into the skin. Starts 05-17-18 and stops day prior to surgery 05-21-18, Disp: , Rfl:  .  hydrochlorothiazide (HYDRODIURIL) 25 MG tablet, Take 25 mg by mouth daily., Disp: , Rfl:  .  HYDROcodone-acetaminophen (NORCO) 5-325 MG tablet, Take 1 tablet by mouth every 6 (six) hours as needed for moderate pain., Disp: 8 tablet, Rfl: 0 .  Magnesium 400 MG CAPS, Take 1 capsule by mouth every other day., Disp: , Rfl:  .  Multiple Vitamin (MULTIVITAMIN) capsule, Take 1 capsule by mouth daily. , Disp: , Rfl:  .  telmisartan (MICARDIS) 80 MG tablet, Take 80 mg by mouth daily., Disp: , Rfl:  .  vitamin C (ASCORBIC ACID) 500 MG tablet, Take 500 mg by mouth daily., Disp: , Rfl:  .  warfarin (COUMADIN) 5 MG tablet, Take 5 mg by mouth daily. , Disp: , Rfl: :  Allergies  Allergen Reactions  . Betadine [Povidone Iodine] Other (See Comments)    Eye burning after eye surgery  . Amoxicillin Hives and Other (See Comments)    Itching  . Bee Venom   :  Family History  Problem Relation Age of Onset  . Cancer Father        brian tumor, gliobalstoma  :  Social History   Socioeconomic History  . Marital status: Married    Spouse name: Not on file  . Number of children: 2  . Years of education: Not on file  . Highest education level: Not on file  Occupational History  . Occupation: Best boy: [employer]  . Financial resource strain: Not on file  . Food insecurity:    Worry: Not on file    Inability: Not on file  . Transportation needs:    Medical: Not on file    Non-medical: Not on file  Tobacco Use  . Smoking status: Never Smoker  . Smokeless tobacco: Never Used  Substance and Sexual Activity  . Alcohol use: Yes    Comment: 4-5 beers a year  . Drug use: No  . Sexual activity: Not Currently  Lifestyle  . Physical activity:    Days per week: Not on file    Minutes per session: Not on file  . Stress: Not on file  Relationships  . Social  connections:    Talks on phone: Not on file    Gets together: Not on file    Attends religious service: Not on file    Active member of club or organization: Not on file    Attends meetings of clubs or organizations: Not on file    Relationship status: Not on file  . Intimate partner violence:    Fear of current or ex partner: Not on file    Emotionally abused: Not on file    Physically abused: Not on file    Forced sexual activity: Not on file  Other Topics Concern  .  Not on file  Social History Narrative  . Not on file  :  Pertinent items are noted in HPI.  Exam: Blood pressure 115/76, pulse 78, temperature 97.7 F (36.5 C), temperature source Oral, resp. rate 18, height 6\' 3"  (1.905 m), weight 250 lb (113.4 kg), SpO2 98 %.  ECOG 0 General appearance: alert and cooperative appeared without distress. Head: atraumatic without any abnormalities. Eyes: conjunctivae/corneas clear. PERRL.  Sclera anicteric. Throat: lips, mucosa, and tongue normal; without oral thrush or ulcers. Resp: clear to auscultation bilaterally without rhonchi, wheezes or dullness to percussion. Cardio: regular rate and rhythm, S1, S2 normal, no murmur, click, rub or gallop GI: soft, non-tender; bowel sounds normal; no masses,  no organomegaly Skin: Skin color, texture, turgor normal. No rashes or lesions Lymph nodes: Cervical, supraclavicular, and axillary nodes normal. Neurologic: Grossly normal without any motor, sensory or deep tendon reflexes. Musculoskeletal: No joint deformity or effusion.  CBC    Component Value Date/Time   WBC 6.0 11/06/2012 0803   RBC 4.64 11/06/2012 0803   HGB 14.5 11/06/2012 0803   HCT 40.2 11/06/2012 0803   PLT 264 11/06/2012 0803   MCV 86.6 11/06/2012 0803   MCH 31.3 11/06/2012 0803   MCHC 36.1 (H) 11/06/2012 0803   RDW 12.6 11/06/2012 0803     Chemistry      Component Value Date/Time   NA 141 10/01/2014 1828   K 3.6 10/01/2014 1828   CL 104 10/01/2014 1828   CO2  25 10/01/2014 1828   BUN 21 10/01/2014 1828   CREATININE 1.21 10/01/2014 1828      Component Value Date/Time   CALCIUM 9.4 10/01/2014 1828   ALKPHOS 54 11/06/2012 0803   AST 24 11/06/2012 0803   ALT 33 11/06/2012 0803   BILITOT 0.4 11/06/2012 0803      Dg Chest 1 View  Result Date: 05/31/2018 CLINICAL DATA:  Repeat chest x-ray with nipple markers EXAM: CHEST  1 VIEW COMPARISON:  05/28/2018 FINDINGS: Normal heart size. Low lung volumes. Subsegmental atelectasis at the left base. Nipple markers are in place. There correspond to the nodular densities. These are therefore nipple shadows. No true pulmonary nodules. IMPRESSION: The nodular densities correspond to the nipple markers and are therefore nipple shadows. No worrisome pulmonary nodules are present. Left basilar subsegmental atelectasis. Electronically Signed   By: Marybelle Killings M.D.   On: 05/31/2018 11:39   Dg Chest 2 View  Result Date: 05/29/2018 CLINICAL DATA:  Right testicular malignancy.  Preop study. EXAM: CHEST - 2 VIEW COMPARISON:  09/18/2015, 09/14/2012, 05/13/2010. FINDINGS: Mediastinum and hilar structures normal. Lungs are clear of acute infiltrates. Questionable tiny nodule over the right lung base, possibly a nipple shadow. Repeat PA and lateral chest x-ray with nipple markers suggested for further evaluation. No pleural effusion or pneumothorax. IMPRESSION: 1. Questionable tiny nodule over the right lung base, possibly a nipple shadow. Repeat PA and lateral chest x-ray with nipple markers suggested for further evaluation. 2. Cardiomegaly. No pulmonary venous congestion. No acute pulmonary infiltrate. Electronically Signed   By: Marcello Moores  Register   On: 05/29/2018 06:01    Assessment and Plan:    68 year old man with the following:  1.  Testicular cancer diagnosed in December 2019.  He presented with right testicular mass and underwent orchiectomy on May 22, 2018.  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  His tumor subtype is spermatocytic tumor without any lymphovascular invasion.  The natural course of this disease, treatment  options and risk of progression was discussed today with the patient and his wife in detail.  Although this is a rare variant of seminoma, the behavior remains more favorable even in the setting of lymphovascular invasion.  The fact that he does not have lymphovascular invasion but still risk of relapse at very low percentage at this time likely 5 to 10% at most.  The role of adjuvant radiation therapy and chemotherapy in the form of carboplatin was discussed today and felt unnecessary at this time.  The risk of overtreating this particular disease is very high especially at this early stage.  We also discussed the potential success of salvage therapy should he develop any recurrent disease at the time.  His cure rate still close to 99% if he develops recurrent disease.  After discussion today, I have recommended active surveillance with abdominal imaging with CT scan abdomen and pelvis every 6 months for 2 years then annually until year 5.  Imaging of the chest will be around the same interval.  Laboratory data with tumor marker and physical exam I also recommended the same interval.  He is agreeable to proceed with this and prefers to have this done under the care of Dr. Jeffie Pollock which he follows his prostate cancer.  2.  Prostate cancer: Status post seed implant for a Gleason score 6 disease.  His last PSA was very low in November 2019.  3.  Follow-up: I am happy to see him in the future as needed.   60  minutes was spent with the patient face-to-face today.  More than 50% of time was dedicated to reviewing his disease status, imaging studies, laboratory data and discussing treatment options.   Thank you for the referral. A copy of this consult has been forwarded to the requesting physician.

## 2018-06-22 ENCOUNTER — Telehealth: Payer: Self-pay

## 2018-06-22 NOTE — Telephone Encounter (Signed)
Per 1/9 no los °

## 2018-06-25 ENCOUNTER — Telehealth: Payer: Self-pay | Admitting: Oncology

## 2018-06-25 DIAGNOSIS — Z86718 Personal history of other venous thrombosis and embolism: Secondary | ICD-10-CM | POA: Diagnosis not present

## 2018-06-25 DIAGNOSIS — I10 Essential (primary) hypertension: Secondary | ICD-10-CM | POA: Diagnosis not present

## 2018-06-25 DIAGNOSIS — Z7901 Long term (current) use of anticoagulants: Secondary | ICD-10-CM | POA: Diagnosis not present

## 2018-06-25 NOTE — Telephone Encounter (Signed)
Faxed medical records to [facility], Release [id number]

## 2018-07-11 DIAGNOSIS — Z1159 Encounter for screening for other viral diseases: Secondary | ICD-10-CM | POA: Diagnosis not present

## 2018-07-11 DIAGNOSIS — Z7901 Long term (current) use of anticoagulants: Secondary | ICD-10-CM | POA: Diagnosis not present

## 2018-07-11 DIAGNOSIS — Z125 Encounter for screening for malignant neoplasm of prostate: Secondary | ICD-10-CM | POA: Diagnosis not present

## 2018-07-11 DIAGNOSIS — E7800 Pure hypercholesterolemia, unspecified: Secondary | ICD-10-CM | POA: Diagnosis not present

## 2018-07-11 DIAGNOSIS — I10 Essential (primary) hypertension: Secondary | ICD-10-CM | POA: Diagnosis not present

## 2018-07-18 DIAGNOSIS — Z0000 Encounter for general adult medical examination without abnormal findings: Secondary | ICD-10-CM | POA: Diagnosis not present

## 2018-07-18 DIAGNOSIS — Z8546 Personal history of malignant neoplasm of prostate: Secondary | ICD-10-CM | POA: Diagnosis not present

## 2018-07-18 DIAGNOSIS — E7800 Pure hypercholesterolemia, unspecified: Secondary | ICD-10-CM | POA: Diagnosis not present

## 2018-07-18 DIAGNOSIS — Z86718 Personal history of other venous thrombosis and embolism: Secondary | ICD-10-CM | POA: Diagnosis not present

## 2018-07-18 DIAGNOSIS — M545 Low back pain: Secondary | ICD-10-CM | POA: Diagnosis not present

## 2018-07-18 DIAGNOSIS — Z8547 Personal history of malignant neoplasm of testis: Secondary | ICD-10-CM | POA: Diagnosis not present

## 2018-07-18 DIAGNOSIS — Z23 Encounter for immunization: Secondary | ICD-10-CM | POA: Diagnosis not present

## 2018-07-18 DIAGNOSIS — N529 Male erectile dysfunction, unspecified: Secondary | ICD-10-CM | POA: Diagnosis not present

## 2018-07-18 DIAGNOSIS — R740 Nonspecific elevation of levels of transaminase and lactic acid dehydrogenase [LDH]: Secondary | ICD-10-CM | POA: Diagnosis not present

## 2018-07-18 DIAGNOSIS — Z7901 Long term (current) use of anticoagulants: Secondary | ICD-10-CM | POA: Diagnosis not present

## 2018-07-18 DIAGNOSIS — I10 Essential (primary) hypertension: Secondary | ICD-10-CM | POA: Diagnosis not present

## 2018-07-18 DIAGNOSIS — I8393 Asymptomatic varicose veins of bilateral lower extremities: Secondary | ICD-10-CM | POA: Diagnosis not present

## 2018-07-19 DIAGNOSIS — Z86718 Personal history of other venous thrombosis and embolism: Secondary | ICD-10-CM | POA: Diagnosis not present

## 2018-07-19 DIAGNOSIS — Z7901 Long term (current) use of anticoagulants: Secondary | ICD-10-CM | POA: Diagnosis not present

## 2018-08-16 DIAGNOSIS — Z86718 Personal history of other venous thrombosis and embolism: Secondary | ICD-10-CM | POA: Diagnosis not present

## 2018-08-16 DIAGNOSIS — Z7901 Long term (current) use of anticoagulants: Secondary | ICD-10-CM | POA: Diagnosis not present

## 2018-08-22 DIAGNOSIS — Z8547 Personal history of malignant neoplasm of testis: Secondary | ICD-10-CM | POA: Diagnosis not present

## 2018-08-22 DIAGNOSIS — Z8546 Personal history of malignant neoplasm of prostate: Secondary | ICD-10-CM | POA: Diagnosis not present

## 2018-09-12 DIAGNOSIS — H33312 Horseshoe tear of retina without detachment, left eye: Secondary | ICD-10-CM | POA: Diagnosis not present

## 2018-09-12 DIAGNOSIS — H43813 Vitreous degeneration, bilateral: Secondary | ICD-10-CM | POA: Diagnosis not present

## 2018-09-12 DIAGNOSIS — H4312 Vitreous hemorrhage, left eye: Secondary | ICD-10-CM | POA: Diagnosis not present

## 2018-09-12 DIAGNOSIS — H43393 Other vitreous opacities, bilateral: Secondary | ICD-10-CM | POA: Diagnosis not present

## 2018-09-20 DIAGNOSIS — Z86718 Personal history of other venous thrombosis and embolism: Secondary | ICD-10-CM | POA: Diagnosis not present

## 2018-09-20 DIAGNOSIS — Z7901 Long term (current) use of anticoagulants: Secondary | ICD-10-CM | POA: Diagnosis not present

## 2018-10-18 DIAGNOSIS — Z86718 Personal history of other venous thrombosis and embolism: Secondary | ICD-10-CM | POA: Diagnosis not present

## 2018-10-18 DIAGNOSIS — I10 Essential (primary) hypertension: Secondary | ICD-10-CM | POA: Diagnosis not present

## 2018-10-18 DIAGNOSIS — Z7901 Long term (current) use of anticoagulants: Secondary | ICD-10-CM | POA: Diagnosis not present

## 2018-11-15 DIAGNOSIS — Z86718 Personal history of other venous thrombosis and embolism: Secondary | ICD-10-CM | POA: Diagnosis not present

## 2018-11-15 DIAGNOSIS — I10 Essential (primary) hypertension: Secondary | ICD-10-CM | POA: Diagnosis not present

## 2018-11-15 DIAGNOSIS — Z7901 Long term (current) use of anticoagulants: Secondary | ICD-10-CM | POA: Diagnosis not present

## 2018-12-18 DIAGNOSIS — H21342 Primary cyst of pars plana, left eye: Secondary | ICD-10-CM | POA: Diagnosis not present

## 2018-12-18 DIAGNOSIS — H43813 Vitreous degeneration, bilateral: Secondary | ICD-10-CM | POA: Diagnosis not present

## 2018-12-18 DIAGNOSIS — H43393 Other vitreous opacities, bilateral: Secondary | ICD-10-CM | POA: Diagnosis not present

## 2018-12-18 DIAGNOSIS — H31092 Other chorioretinal scars, left eye: Secondary | ICD-10-CM | POA: Diagnosis not present

## 2018-12-19 DIAGNOSIS — L570 Actinic keratosis: Secondary | ICD-10-CM | POA: Diagnosis not present

## 2018-12-19 DIAGNOSIS — D2262 Melanocytic nevi of left upper limb, including shoulder: Secondary | ICD-10-CM | POA: Diagnosis not present

## 2018-12-19 DIAGNOSIS — X32XXXA Exposure to sunlight, initial encounter: Secondary | ICD-10-CM | POA: Diagnosis not present

## 2018-12-19 DIAGNOSIS — L821 Other seborrheic keratosis: Secondary | ICD-10-CM | POA: Diagnosis not present

## 2018-12-19 DIAGNOSIS — D485 Neoplasm of uncertain behavior of skin: Secondary | ICD-10-CM | POA: Diagnosis not present

## 2018-12-19 DIAGNOSIS — S80862A Insect bite (nonvenomous), left lower leg, initial encounter: Secondary | ICD-10-CM | POA: Diagnosis not present

## 2018-12-19 DIAGNOSIS — D225 Melanocytic nevi of trunk: Secondary | ICD-10-CM | POA: Diagnosis not present

## 2018-12-20 DIAGNOSIS — I10 Essential (primary) hypertension: Secondary | ICD-10-CM | POA: Diagnosis not present

## 2018-12-20 DIAGNOSIS — Z86718 Personal history of other venous thrombosis and embolism: Secondary | ICD-10-CM | POA: Diagnosis not present

## 2018-12-20 DIAGNOSIS — Z7901 Long term (current) use of anticoagulants: Secondary | ICD-10-CM | POA: Diagnosis not present

## 2018-12-24 DIAGNOSIS — C6290 Malignant neoplasm of unspecified testis, unspecified whether descended or undescended: Secondary | ICD-10-CM | POA: Diagnosis not present

## 2018-12-24 DIAGNOSIS — Z8546 Personal history of malignant neoplasm of prostate: Secondary | ICD-10-CM | POA: Diagnosis not present

## 2018-12-24 DIAGNOSIS — Z8547 Personal history of malignant neoplasm of testis: Secondary | ICD-10-CM | POA: Diagnosis not present

## 2018-12-27 DIAGNOSIS — Z8547 Personal history of malignant neoplasm of testis: Secondary | ICD-10-CM | POA: Diagnosis not present

## 2018-12-27 DIAGNOSIS — N281 Cyst of kidney, acquired: Secondary | ICD-10-CM | POA: Diagnosis not present

## 2018-12-27 DIAGNOSIS — Z9079 Acquired absence of other genital organ(s): Secondary | ICD-10-CM | POA: Diagnosis not present

## 2018-12-27 DIAGNOSIS — K7689 Other specified diseases of liver: Secondary | ICD-10-CM | POA: Diagnosis not present

## 2018-12-27 DIAGNOSIS — C6291 Malignant neoplasm of right testis, unspecified whether descended or undescended: Secondary | ICD-10-CM | POA: Diagnosis not present

## 2019-01-02 DIAGNOSIS — N5201 Erectile dysfunction due to arterial insufficiency: Secondary | ICD-10-CM | POA: Diagnosis not present

## 2019-01-02 DIAGNOSIS — Z8546 Personal history of malignant neoplasm of prostate: Secondary | ICD-10-CM | POA: Diagnosis not present

## 2019-01-02 DIAGNOSIS — Z8547 Personal history of malignant neoplasm of testis: Secondary | ICD-10-CM | POA: Diagnosis not present

## 2019-01-21 DIAGNOSIS — H16141 Punctate keratitis, right eye: Secondary | ICD-10-CM | POA: Diagnosis not present

## 2019-01-22 DIAGNOSIS — Z86718 Personal history of other venous thrombosis and embolism: Secondary | ICD-10-CM | POA: Diagnosis not present

## 2019-01-22 DIAGNOSIS — I10 Essential (primary) hypertension: Secondary | ICD-10-CM | POA: Diagnosis not present

## 2019-01-22 DIAGNOSIS — Z7901 Long term (current) use of anticoagulants: Secondary | ICD-10-CM | POA: Diagnosis not present

## 2019-02-07 DIAGNOSIS — H2511 Age-related nuclear cataract, right eye: Secondary | ICD-10-CM | POA: Diagnosis not present

## 2019-02-13 DIAGNOSIS — G7000 Myasthenia gravis without (acute) exacerbation: Secondary | ICD-10-CM | POA: Diagnosis not present

## 2019-02-20 DIAGNOSIS — H25811 Combined forms of age-related cataract, right eye: Secondary | ICD-10-CM | POA: Diagnosis not present

## 2019-02-20 DIAGNOSIS — H2511 Age-related nuclear cataract, right eye: Secondary | ICD-10-CM | POA: Diagnosis not present

## 2019-02-26 DIAGNOSIS — I10 Essential (primary) hypertension: Secondary | ICD-10-CM | POA: Diagnosis not present

## 2019-02-26 DIAGNOSIS — Z7901 Long term (current) use of anticoagulants: Secondary | ICD-10-CM | POA: Diagnosis not present

## 2019-02-26 DIAGNOSIS — Z86718 Personal history of other venous thrombosis and embolism: Secondary | ICD-10-CM | POA: Diagnosis not present

## 2019-03-11 DIAGNOSIS — H2511 Age-related nuclear cataract, right eye: Secondary | ICD-10-CM | POA: Diagnosis not present

## 2019-03-12 DIAGNOSIS — I10 Essential (primary) hypertension: Secondary | ICD-10-CM | POA: Diagnosis not present

## 2019-03-12 DIAGNOSIS — Z86718 Personal history of other venous thrombosis and embolism: Secondary | ICD-10-CM | POA: Diagnosis not present

## 2019-03-12 DIAGNOSIS — Z7901 Long term (current) use of anticoagulants: Secondary | ICD-10-CM | POA: Diagnosis not present

## 2019-03-26 DIAGNOSIS — I10 Essential (primary) hypertension: Secondary | ICD-10-CM | POA: Diagnosis not present

## 2019-03-26 DIAGNOSIS — Z86718 Personal history of other venous thrombosis and embolism: Secondary | ICD-10-CM | POA: Diagnosis not present

## 2019-03-26 DIAGNOSIS — Z7901 Long term (current) use of anticoagulants: Secondary | ICD-10-CM | POA: Diagnosis not present

## 2019-05-08 ENCOUNTER — Other Ambulatory Visit: Payer: Self-pay

## 2019-07-08 DIAGNOSIS — N281 Cyst of kidney, acquired: Secondary | ICD-10-CM | POA: Diagnosis not present

## 2019-07-08 DIAGNOSIS — C6290 Malignant neoplasm of unspecified testis, unspecified whether descended or undescended: Secondary | ICD-10-CM | POA: Diagnosis not present

## 2019-07-08 DIAGNOSIS — Z8547 Personal history of malignant neoplasm of testis: Secondary | ICD-10-CM | POA: Diagnosis not present

## 2019-07-09 DIAGNOSIS — C6290 Malignant neoplasm of unspecified testis, unspecified whether descended or undescended: Secondary | ICD-10-CM | POA: Diagnosis not present

## 2019-07-09 DIAGNOSIS — H33312 Horseshoe tear of retina without detachment, left eye: Secondary | ICD-10-CM | POA: Diagnosis not present

## 2019-07-09 DIAGNOSIS — Z8547 Personal history of malignant neoplasm of testis: Secondary | ICD-10-CM | POA: Diagnosis not present

## 2019-07-10 ENCOUNTER — Ambulatory Visit: Payer: Medicare Other

## 2019-07-11 ENCOUNTER — Ambulatory Visit: Payer: Medicare Other

## 2019-07-11 DIAGNOSIS — N281 Cyst of kidney, acquired: Secondary | ICD-10-CM | POA: Diagnosis not present

## 2019-07-11 DIAGNOSIS — Z8547 Personal history of malignant neoplasm of testis: Secondary | ICD-10-CM | POA: Diagnosis not present

## 2019-07-11 DIAGNOSIS — Z8546 Personal history of malignant neoplasm of prostate: Secondary | ICD-10-CM | POA: Diagnosis not present

## 2019-07-19 ENCOUNTER — Ambulatory Visit: Payer: Medicare Other | Attending: Internal Medicine

## 2019-07-19 DIAGNOSIS — Z23 Encounter for immunization: Secondary | ICD-10-CM

## 2019-07-19 NOTE — Progress Notes (Signed)
   Covid-19 Vaccination Clinic  Name:  Craig Booth    MRN: [id number] DOB: 12-23-1950  07/19/2019  Craig Booth was observed post Covid-19 immunization for 15 minutes without incidence. He was provided with Vaccine Information Sheet and instruction to access the V-Safe system.   Craig Booth was instructed to call 911 with any severe reactions post vaccine: Marland Kitchen Difficulty breathing  . Swelling of your face and throat  . A fast heartbeat  . A bad rash all over your body  . Dizziness and weakness    Immunizations Administered    Name Date Dose VIS Date Route   Pfizer COVID-19 Vaccine 07/19/2019  3:40 PM 0.3 mL 05/24/2019 Intramuscular   Manufacturer: [place]   Lot: [id number]   [place]: [id number]

## 2019-07-22 DIAGNOSIS — I10 Essential (primary) hypertension: Secondary | ICD-10-CM | POA: Diagnosis not present

## 2019-07-22 DIAGNOSIS — Z125 Encounter for screening for malignant neoplasm of prostate: Secondary | ICD-10-CM | POA: Diagnosis not present

## 2019-07-22 DIAGNOSIS — E7800 Pure hypercholesterolemia, unspecified: Secondary | ICD-10-CM | POA: Diagnosis not present

## 2019-07-23 DIAGNOSIS — L298 Other pruritus: Secondary | ICD-10-CM | POA: Diagnosis not present

## 2019-07-23 DIAGNOSIS — D2262 Melanocytic nevi of left upper limb, including shoulder: Secondary | ICD-10-CM | POA: Diagnosis not present

## 2019-07-23 DIAGNOSIS — L814 Other melanin hyperpigmentation: Secondary | ICD-10-CM | POA: Diagnosis not present

## 2019-07-23 DIAGNOSIS — Z85828 Personal history of other malignant neoplasm of skin: Secondary | ICD-10-CM | POA: Diagnosis not present

## 2019-07-23 DIAGNOSIS — D0462 Carcinoma in situ of skin of left upper limb, including shoulder: Secondary | ICD-10-CM | POA: Diagnosis not present

## 2019-07-23 DIAGNOSIS — L821 Other seborrheic keratosis: Secondary | ICD-10-CM | POA: Diagnosis not present

## 2019-07-23 DIAGNOSIS — C4441 Basal cell carcinoma of skin of scalp and neck: Secondary | ICD-10-CM | POA: Diagnosis not present

## 2019-07-23 DIAGNOSIS — C44719 Basal cell carcinoma of skin of left lower limb, including hip: Secondary | ICD-10-CM | POA: Diagnosis not present

## 2019-07-23 DIAGNOSIS — D225 Melanocytic nevi of trunk: Secondary | ICD-10-CM | POA: Diagnosis not present

## 2019-07-23 DIAGNOSIS — C44212 Basal cell carcinoma of skin of right ear and external auricular canal: Secondary | ICD-10-CM | POA: Diagnosis not present

## 2019-07-23 DIAGNOSIS — D2272 Melanocytic nevi of left lower limb, including hip: Secondary | ICD-10-CM | POA: Diagnosis not present

## 2019-07-23 DIAGNOSIS — D2261 Melanocytic nevi of right upper limb, including shoulder: Secondary | ICD-10-CM | POA: Diagnosis not present

## 2019-07-25 DIAGNOSIS — Z0000 Encounter for general adult medical examination without abnormal findings: Secondary | ICD-10-CM | POA: Diagnosis not present

## 2019-07-25 DIAGNOSIS — N529 Male erectile dysfunction, unspecified: Secondary | ICD-10-CM | POA: Diagnosis not present

## 2019-07-25 DIAGNOSIS — Z6827 Body mass index (BMI) 27.0-27.9, adult: Secondary | ICD-10-CM | POA: Diagnosis not present

## 2019-07-25 DIAGNOSIS — I8393 Asymptomatic varicose veins of bilateral lower extremities: Secondary | ICD-10-CM | POA: Diagnosis not present

## 2019-07-25 DIAGNOSIS — E7800 Pure hypercholesterolemia, unspecified: Secondary | ICD-10-CM | POA: Diagnosis not present

## 2019-07-25 DIAGNOSIS — I10 Essential (primary) hypertension: Secondary | ICD-10-CM | POA: Diagnosis not present

## 2019-07-25 DIAGNOSIS — Z8547 Personal history of malignant neoplasm of testis: Secondary | ICD-10-CM | POA: Diagnosis not present

## 2019-07-25 DIAGNOSIS — Z86718 Personal history of other venous thrombosis and embolism: Secondary | ICD-10-CM | POA: Diagnosis not present

## 2019-07-31 DIAGNOSIS — C44719 Basal cell carcinoma of skin of left lower limb, including hip: Secondary | ICD-10-CM | POA: Diagnosis not present

## 2019-07-31 DIAGNOSIS — Z85828 Personal history of other malignant neoplasm of skin: Secondary | ICD-10-CM | POA: Diagnosis not present

## 2019-08-07 DIAGNOSIS — Z85828 Personal history of other malignant neoplasm of skin: Secondary | ICD-10-CM | POA: Diagnosis not present

## 2019-08-07 DIAGNOSIS — C4441 Basal cell carcinoma of skin of scalp and neck: Secondary | ICD-10-CM | POA: Diagnosis not present

## 2019-08-13 ENCOUNTER — Ambulatory Visit: Payer: Medicare Other | Attending: Internal Medicine

## 2019-08-13 DIAGNOSIS — Z23 Encounter for immunization: Secondary | ICD-10-CM

## 2019-08-13 NOTE — Progress Notes (Signed)
   Covid-19 Vaccination Clinic  Name:  Deylin Adley    MRN: [id number] DOB: [birth year]  08/13/2019  Mr. Poelman was observed post Covid-19 immunization for 15 minutes without incident. He was provided with Vaccine Information Sheet and instruction to access the V-Safe system.   Mr. Rainge was instructed to call 911 with any severe reactions post vaccine: Marland Kitchen Difficulty breathing  . Swelling of face and throat  . A fast heartbeat  . A bad rash all over body  . Dizziness and weakness   Immunizations Administered    Name Date Dose VIS Date Route   Pfizer COVID-19 Vaccine 08/13/2019  3:19 PM 0.3 mL 05/24/2019 Intramuscular   Manufacturer: [place]   Lot: [id number]   [place]: [id number]

## 2019-11-04 DIAGNOSIS — M7989 Other specified soft tissue disorders: Secondary | ICD-10-CM | POA: Diagnosis not present

## 2019-11-04 DIAGNOSIS — Z23 Encounter for immunization: Secondary | ICD-10-CM | POA: Diagnosis not present

## 2019-11-04 DIAGNOSIS — S91332A Puncture wound without foreign body, left foot, initial encounter: Secondary | ICD-10-CM | POA: Diagnosis not present

## 2020-01-01 DIAGNOSIS — Z8546 Personal history of malignant neoplasm of prostate: Secondary | ICD-10-CM | POA: Diagnosis not present

## 2020-01-03 ENCOUNTER — Other Ambulatory Visit (HOSPITAL_COMMUNITY): Payer: Self-pay | Admitting: Urology

## 2020-01-03 ENCOUNTER — Ambulatory Visit (HOSPITAL_COMMUNITY)
Admission: RE | Admit: 2020-01-03 | Discharge: 2020-01-03 | Disposition: A | Discharge status: Home / Self Care | Payer: Medicare Other | Source: Ambulatory Visit | Attending: Urology | Admitting: Urology

## 2020-01-03 ENCOUNTER — Other Ambulatory Visit: Payer: Self-pay

## 2020-01-03 DIAGNOSIS — C6290 Malignant neoplasm of unspecified testis, unspecified whether descended or undescended: Secondary | ICD-10-CM | POA: Diagnosis not present

## 2020-01-03 DIAGNOSIS — N2889 Other specified disorders of kidney and ureter: Secondary | ICD-10-CM | POA: Diagnosis not present

## 2020-01-03 DIAGNOSIS — Z8547 Personal history of malignant neoplasm of testis: Secondary | ICD-10-CM

## 2020-01-03 DIAGNOSIS — J90 Pleural effusion, not elsewhere classified: Secondary | ICD-10-CM | POA: Diagnosis not present

## 2020-01-08 DIAGNOSIS — N5201 Erectile dysfunction due to arterial insufficiency: Secondary | ICD-10-CM | POA: Diagnosis not present

## 2020-01-08 DIAGNOSIS — Z8547 Personal history of malignant neoplasm of testis: Secondary | ICD-10-CM | POA: Diagnosis not present

## 2020-01-08 DIAGNOSIS — Z8546 Personal history of malignant neoplasm of prostate: Secondary | ICD-10-CM | POA: Diagnosis not present

## 2020-02-11 ENCOUNTER — Encounter (HOSPITAL_COMMUNITY): Payer: Self-pay | Admitting: Emergency Medicine

## 2020-02-11 ENCOUNTER — Emergency Department (HOSPITAL_COMMUNITY): Payer: Medicare Other

## 2020-02-11 ENCOUNTER — Emergency Department (HOSPITAL_COMMUNITY)
Admission: EM | Admit: 2020-02-11 | Discharge: 2020-02-11 | Disposition: A | Discharge status: Home / Self Care | Payer: Medicare Other | Attending: Emergency Medicine | Admitting: Emergency Medicine

## 2020-02-11 ENCOUNTER — Other Ambulatory Visit: Payer: Self-pay

## 2020-02-11 DIAGNOSIS — R413 Other amnesia: Secondary | ICD-10-CM

## 2020-02-11 DIAGNOSIS — Z79899 Other long term (current) drug therapy: Secondary | ICD-10-CM | POA: Insufficient documentation

## 2020-02-11 DIAGNOSIS — R29818 Other symptoms and signs involving the nervous system: Secondary | ICD-10-CM | POA: Diagnosis not present

## 2020-02-11 DIAGNOSIS — Z20822 Contact with and (suspected) exposure to covid-19: Secondary | ICD-10-CM | POA: Insufficient documentation

## 2020-02-11 DIAGNOSIS — I10 Essential (primary) hypertension: Secondary | ICD-10-CM | POA: Diagnosis not present

## 2020-02-11 DIAGNOSIS — I6523 Occlusion and stenosis of bilateral carotid arteries: Secondary | ICD-10-CM | POA: Diagnosis not present

## 2020-02-11 DIAGNOSIS — G454 Transient global amnesia: Secondary | ICD-10-CM | POA: Diagnosis not present

## 2020-02-11 DIAGNOSIS — Z8546 Personal history of malignant neoplasm of prostate: Secondary | ICD-10-CM | POA: Diagnosis not present

## 2020-02-11 DIAGNOSIS — Z7901 Long term (current) use of anticoagulants: Secondary | ICD-10-CM | POA: Insufficient documentation

## 2020-02-11 DIAGNOSIS — I6503 Occlusion and stenosis of bilateral vertebral arteries: Secondary | ICD-10-CM | POA: Diagnosis not present

## 2020-02-11 LAB — CBG MONITORING, ED: Glucose-Capillary: 135 mg/dL — ABNORMAL HIGH (ref 70–99)

## 2020-02-11 LAB — COMPREHENSIVE METABOLIC PANEL
ALT: 30 U/L (ref 0–44)
AST: 27 U/L (ref 15–41)
Albumin: 4.3 g/dL (ref 3.5–5.0)
Alkaline Phosphatase: 65 U/L (ref 38–126)
Anion gap: 10 (ref 5–15)
BUN: 15 mg/dL (ref 8–23)
CO2: 26 mmol/L (ref 22–32)
Calcium: 9.2 mg/dL (ref 8.9–10.3)
Chloride: 102 mmol/L (ref 98–111)
Creatinine, Ser: 1.13 mg/dL (ref 0.61–1.24)
GFR calc Af Amer: >60 mL/min (ref >60)
GFR calc non Af Amer: >60 mL/min (ref >60)
Glucose, Bld: 130 mg/dL — ABNORMAL HIGH (ref 70–99)
Potassium: 3.9 mmol/L (ref 3.5–5.1)
Sodium: 138 mmol/L (ref 135–145)
Total Bilirubin: 1 mg/dL (ref 0.3–1.2)
Total Protein: 6.8 g/dL (ref 6.5–8.1)

## 2020-02-11 LAB — CBC
HCT: 44.6 % (ref 39.0–52.0)
Hemoglobin: 14.9 g/dL (ref 13.0–17.0)
MCH: 31 pg (ref 26.0–34.0)
MCHC: 33.4 g/dL (ref 30.0–36.0)
MCV: 92.9 fL (ref 80.0–100.0)
Platelets: 262 10*3/uL (ref 150–400)
RBC: 4.8 MIL/uL (ref 4.22–5.81)
RDW: 12.4 % (ref 11.5–15.5)
WBC: 7.6 10*3/uL (ref 4.0–10.5)
nRBC: 0 % (ref 0.0–0.2)

## 2020-02-11 LAB — DIFFERENTIAL
Abs Immature Granulocytes: 0.03 10*3/uL (ref 0.00–0.07)
Basophils Absolute: 0 10*3/uL (ref 0.0–0.1)
Basophils Relative: 0 %
Eosinophils Absolute: 0.1 10*3/uL (ref 0.0–0.5)
Eosinophils Relative: 1 %
Immature Granulocytes: 0 %
Lymphocytes Relative: 25 %
Lymphs Abs: 1.9 10*3/uL (ref 0.7–4.0)
Monocytes Absolute: 0.5 10*3/uL (ref 0.1–1.0)
Monocytes Relative: 7 %
Neutro Abs: 5.1 10*3/uL (ref 1.7–7.7)
Neutrophils Relative %: 67 %

## 2020-02-11 LAB — URINALYSIS, ROUTINE W REFLEX MICROSCOPIC
Bilirubin Urine: NEGATIVE
Glucose, UA: NEGATIVE mg/dL
Hgb urine dipstick: NEGATIVE
Ketones, ur: NEGATIVE mg/dL
Leukocytes,Ua: NEGATIVE
Nitrite: NEGATIVE
Protein, ur: NEGATIVE mg/dL
Specific Gravity, Urine: 1.004 — ABNORMAL LOW (ref 1.005–1.030)
pH: 6 (ref 5.0–8.0)

## 2020-02-11 LAB — I-STAT CHEM 8, ED
BUN: 16 mg/dL (ref 8–23)
Calcium, Ion: 1.13 mmol/L — ABNORMAL LOW (ref 1.15–1.40)
Chloride: 102 mmol/L (ref 98–111)
Creatinine, Ser: 1 mg/dL (ref 0.61–1.24)
Glucose, Bld: 128 mg/dL — ABNORMAL HIGH (ref 70–99)
HCT: 44 % (ref 39.0–52.0)
Hemoglobin: 15 g/dL (ref 13.0–17.0)
Potassium: 3.8 mmol/L (ref 3.5–5.1)
Sodium: 140 mmol/L (ref 135–145)
TCO2: 25 mmol/L (ref 22–32)

## 2020-02-11 LAB — RAPID URINE DRUG SCREEN, HOSP PERFORMED
Amphetamines: NOT DETECTED
Barbiturates: NOT DETECTED
Benzodiazepines: NOT DETECTED
Cocaine: NOT DETECTED
Opiates: NOT DETECTED
Tetrahydrocannabinol: NOT DETECTED

## 2020-02-11 LAB — PROTIME-INR
INR: 1.4 — ABNORMAL HIGH (ref 0.8–1.2)
Prothrombin Time: 16.4 seconds — ABNORMAL HIGH (ref 11.4–15.2)

## 2020-02-11 LAB — SARS CORONAVIRUS 2 BY RT PCR (HOSPITAL ORDER, PERFORMED IN ~~LOC~~ HOSPITAL LAB): SARS Coronavirus 2: NEGATIVE

## 2020-02-11 LAB — APTT: aPTT: 33 seconds (ref 24–36)

## 2020-02-11 MED ORDER — SODIUM CHLORIDE 0.9% FLUSH: 3.0000 mL | Freq: Once | INTRAVENOUS | Status: DC

## 2020-02-11 MED ORDER — IOHEXOL 350 MG/ML SOLN
50.0000 mL | Freq: Once | INTRAVENOUS | Status: AC | PRN
  Administered 2020-02-11: 50 mL via INTRAVENOUS

## 2020-02-11 NOTE — ED Provider Notes (Signed)
[facility] EMERGENCY DEPARTMENT Provider Note   CSN: [id number] Arrival date & time: 02/11/20  1450     History Chief Complaint  Patient presents with   Altered Mental Status    Craig Booth is a 69 y.o. male.  The history is provided by the patient, medical records and the spouse. No language interpreter was used.  Neurologic Problem This is a new problem. The current episode started 1 to 2 hours ago. The problem occurs constantly. The problem has not changed since onset.Pertinent negatives include no chest pain, no abdominal pain, no headaches and no shortness of breath. Nothing aggravates the symptoms. Nothing relieves the symptoms. He has tried nothing for the symptoms. The treatment provided no relief.       Past Medical History:  Diagnosis Date   Anticoagulated on Coumadin    Auditory vertigo involving left ear VIRAL EAR INFECTION IMPROVING   Compressed vertebrae    YRS AGO--  LUMBAR   Factor V Leiden mutation ([place])    MONTIORED BY DR Glennon Hamilton W/ [facility]   History of basal cell carcinoma excision    History of DVT of lower extremity RIGHT /NOV 2011   and right groin   History of kidney stones    History of phlebitis NOV 2011   Hypercholesterolemia    improved with diet, not taking medication   Hypertension    Incomplete RBBB    Organic impotence    Pneumonia    Prostate cancer ([place]) 06/25/2012   Adenocarcinoma,gleason=3+3=6,PSA=3.8,volume=44cc    Patient Active Problem List   Diagnosis Date Noted   Phlebitis    Prostate cancer ([place]) 35/70/1779   Umbilical hernia 39/08/90   Hypertension 12/22/2010   Factor V Leiden ([place]) 12/22/2010    Past Surgical History:  Procedure Laterality Date   COLONOSCOPY  2012   CYSTOSCOPY N/A 11/08/2012   Procedure: CYSTOSCOPY FLEXIBLE;  Surgeon: Malka So, MD;  Location: [facility];  Service: Urology;  Laterality: N/A;  NO SEEDS FOUND IN BLADDER   CYSTOSCOPY  WITH RETROGRADE PYELOGRAM, URETEROSCOPY AND STENT PLACEMENT Right 10/01/2014   Procedure: CYSTOSCOPY WITH RETROGRADE PYELOGRAM, URETEROSCOPY, STONE REMOVAL, AND STENT PLACEMENT;  Surgeon: Raynelle Bring, MD;  Location: [facility];  Service: Urology;  Laterality: Right;   HOLMIUM LASER APPLICATION Right 09/09/760   Procedure: HOLMIUM LASER APPLICATION;  Surgeon: Raynelle Bring, MD;  Location: [facility];  Service: Urology;  Laterality: Right;   ORCHIECTOMY Right 05/22/2018   Procedure: ORCHIECTOMY, RADICAL;  Surgeon: Irine Seal, MD;  Location: [facility];  Service: Urology;  Laterality: Right;   PROSTATE BIOPSY  06/25/2012   Adenocarcinoma   RADIOACTIVE SEED IMPLANT N/A 11/08/2012   Procedure:  42 RADIOACTIVE SEED IMPLANT;  Surgeon: Malka So, MD;  Location: [facility];  Service: Urology;  Laterality: N/A;      SEEDS IMPLANTED   TONSILLECTOMY  AGE 89   UMBILICAL HERNIA REPAIR  02/10/2011       Family History  Problem Relation Age of Onset   Cancer Father        brian tumor, gliobalstoma    Social History   Tobacco Use   Smoking status: Never Smoker   Smokeless tobacco: Never Used  Vaping Use   Vaping Use: Never used  Substance Use Topics   Alcohol use: Yes    Comment: 4-5 beers a year   Drug use: No    Home Medications Prior to Admission medications   Medication Sig Start Date End  Date Taking? Authorizing Provider  atorvastatin (LIPITOR) 10 MG tablet Take 10 mg by mouth daily.    [provider]  enoxaparin (LOVENOX) 120 MG/0.8ML injection Inject 110 mg into the skin. Starts 05-17-18 and stops day prior to surgery 05-21-18    [provider]  hydrochlorothiazide (HYDRODIURIL) 25 MG tablet Take 25 mg by mouth daily.    [provider]  HYDROcodone-acetaminophen (NORCO) 5-325 MG tablet Take 1 tablet by mouth every 6 (six) hours as needed for moderate pain. 05/22/18   Irine Seal, MD  Magnesium 400 MG CAPS Take 1 capsule by mouth every  other day.    [provider]  Multiple Vitamin (MULTIVITAMIN) capsule Take 1 capsule by mouth daily.     [provider]  telmisartan (MICARDIS) 80 MG tablet Take 80 mg by mouth daily.    [provider]  vitamin C (ASCORBIC ACID) 500 MG tablet Take 500 mg by mouth daily.    [provider]  warfarin (COUMADIN) 5 MG tablet Take 5 mg by mouth daily.     [provider]    Allergies    Betadine [povidone iodine], Amoxicillin, and Bee venom  Review of Systems   Review of Systems  Constitutional: Negative for chills, diaphoresis, fatigue and fever.  HENT: Negative for congestion.   Eyes: Negative for photophobia and visual disturbance.  Respiratory: Negative for cough, chest tightness, shortness of breath, wheezing and stridor.   Cardiovascular: Negative for chest pain, palpitations and leg swelling.  Gastrointestinal: Negative for abdominal pain, constipation, diarrhea, nausea and vomiting.  Genitourinary: Negative for flank pain and frequency.  Musculoskeletal: Negative for back pain, neck pain and neck stiffness.  Neurological: Negative for dizziness, weakness, light-headedness, numbness and headaches.  Psychiatric/Behavioral: Negative for agitation and confusion.  All other systems reviewed and are negative.   Physical Exam Updated Vital Signs BP (!) 151/98 (BP Location: Right Arm)    Pulse 72    Temp 98.6 F (37 C) (Oral)    Resp 12    Ht 6\' 2"  (1.88 m)    Wt 99.8 kg    SpO2 98%    BMI 28.25 kg/m   Physical Exam Vitals and nursing note reviewed.  Constitutional:      General: He is not in acute distress.    Appearance: He is well-developed. He is not ill-appearing, toxic-appearing or diaphoretic.  HENT:     Head: Normocephalic and atraumatic.     Nose: Nose normal. No congestion or rhinorrhea.     Mouth/Throat:     Mouth: Mucous membranes are moist.     Pharynx: No oropharyngeal exudate or posterior oropharyngeal erythema.    Eyes:     Extraocular Movements: Extraocular movements intact.     Conjunctiva/sclera: Conjunctivae normal.     Pupils: Pupils are equal, round, and reactive to light.  Cardiovascular:     Rate and Rhythm: Normal rate and regular rhythm.     Heart sounds: No murmur heard.   Pulmonary:     Effort: Pulmonary effort is normal. No respiratory distress.     Breath sounds: Normal breath sounds. No wheezing, rhonchi or rales.  Chest:     Chest wall: No tenderness.  Abdominal:     General: Abdomen is flat.     Palpations: Abdomen is soft.     Tenderness: There is no abdominal tenderness. There is no right CVA tenderness, left CVA tenderness, guarding or rebound.  Musculoskeletal:        General:  No tenderness.     Cervical back: Neck supple. No tenderness.  Skin:    General: Skin is warm and dry.     Capillary Refill: Capillary refill takes less than 2 seconds.     Findings: No rash.  Neurological:     Mental Status: He is alert.     GCS: GCS eye subscore is 4. GCS verbal subscore is 5. GCS motor subscore is 6.     Cranial Nerves: No cranial nerve deficit, dysarthria or facial asymmetry.     Sensory: No sensory deficit.     Motor: No tremor, abnormal muscle tone or seizure activity.     Coordination: Coordination normal. Finger-Nose-Finger Test and Heel to [place] Test normal.     Comments: Patient had frequent repetitive questions and could not remember the start of the conversation.  No other focal neurologic deficits appreciated on exam.     ED Results / Procedures / Treatments   Labs (all labs ordered are listed, but only abnormal results are displayed) Labs Reviewed  PROTIME-INR - Abnormal; Notable for the following components:      Result Value   Prothrombin Time 16.4 (*)    INR 1.4 (*)    All other components within normal limits  COMPREHENSIVE METABOLIC PANEL - Abnormal; Notable for the following components:   Glucose, Bld 130 (*)    All other components within normal  limits  URINALYSIS, ROUTINE W REFLEX MICROSCOPIC - Abnormal; Notable for the following components:   Color, Urine STRAW (*)    Specific Gravity, Urine 1.004 (*)    All other components within normal limits  CBG MONITORING, ED - Abnormal; Notable for the following components:   Glucose-Capillary 135 (*)    All other components within normal limits  I-STAT CHEM 8, ED - Abnormal; Notable for the following components:   Glucose, Bld 128 (*)    Calcium, Ion 1.13 (*)    All other components within normal limits  SARS CORONAVIRUS 2 BY RT PCR (HOSPITAL ORDER, [facility])  APTT  CBC  DIFFERENTIAL  RAPID URINE DRUG SCREEN, HOSP PERFORMED    EKG EKG Interpretation  Date/Time:  Tuesday February 11 2020 15:31:40 EDT Ventricular Rate:  87 PR Interval:  162 QRS Duration: 118 QT Interval:  366 QTC Calculation: 440 R Axis:   20 Text Interpretation: Normal sinus rhythm Inferior infarct , age undetermined Cannot rule out Anterior infarct , age undetermined Abnormal ECG when comapred to prior, similar apperance to prior. No STEMI Confirmed by Antony Blackbird [id number]) on 02/11/2020 3:57:19 PM   Radiology MR BRAIN WO CONTRAST  Result Date: 02/11/2020 CLINICAL DATA:  Memory loss EXAM: MRI HEAD WITHOUT CONTRAST TECHNIQUE: Multiplanar, multiecho pulse sequences of the brain and surrounding structures were obtained without intravenous contrast. COMPARISON:  None. FINDINGS: BRAIN: No acute infarct, acute hemorrhage or extra-axial collection. Normal white matter signal. Normal volume of CSF spaces. No chronic microhemorrhage. Normal midline structures. VASCULAR: Major flow voids are preserved. SKULL AND UPPER CERVICAL SPINE: Normal calvarium and skull base. Visualized upper cervical spine and soft tissues are normal. SINUSES/ORBITS: No paranasal sinus fluid levels or advanced mucosal thickening. No mastoid or middle ear effusion. Normal orbits. IMPRESSION: Normal brain MRI.  Electronically Signed   By: Ulyses Jarred M.D.   On: 02/11/2020 19:03   CT HEAD CODE STROKE WO CONTRAST  Result Date: 02/11/2020 CLINICAL DATA:  Code stroke. Acute stroke presentation. Memory loss. EXAM: CT HEAD WITHOUT CONTRAST TECHNIQUE: Contiguous axial images  were obtained from the base of the skull through the vertex without intravenous contrast. COMPARISON:  07/20/2017 FINDINGS: Brain: Mild age related volume loss. No sign of old or acute focal infarction, mass lesion, hemorrhage, hydrocephalus or extra-axial collection. Vascular: Atherosclerotic dolichoectasia of the vessels at the base of the brain. Skull: Negative Sinuses/Orbits: Clear/normal Other: None ASPECTS ([place] Stroke Program Early CT Score) - Ganglionic level infarction (caudate, lentiform nuclei, internal capsule, insula, M1-M3 cortex): 7 - Supraganglionic infarction (M4-M6 cortex): 3 Total score (0-10 with 10 being normal): 10 IMPRESSION: 1. No acute finding by CT. Mild age related volume loss. Atherosclerotic dolichoectasia of the vessels at the base of the brain. 2. ASPECTS is 10. Electronically Signed   By: Nelson Chimes M.D.   On: 02/11/2020 17:19   CT ANGIO HEAD CODE STROKE  Result Date: 02/11/2020 CLINICAL DATA:  Acute onset memory loss. EXAM: CT ANGIOGRAPHY HEAD AND NECK TECHNIQUE: Multidetector CT imaging of the head and neck was performed using the standard protocol during bolus administration of intravenous contrast. Multiplanar CT image reconstructions and MIPs were obtained to evaluate the vascular anatomy. Carotid stenosis measurements (when applicable) are obtained utilizing NASCET criteria, using the distal internal carotid diameter as the denominator. CONTRAST:  47mL OMNIPAQUE IOHEXOL 350 MG/ML SOLN COMPARISON:  Head CT same day FINDINGS: CTA NECK FINDINGS Aortic arch: Aortic atherosclerosis. Branching pattern is normal without origin stenosis. Right carotid system: Common carotid artery widely patent to the bifurcation.  Mild plaque at the bifurcation and ICA bulb but no stenosis. Cervical ICA widely patent. Left carotid system: Common carotid artery widely patent to the bifurcation. Mild plaque at the carotid bifurcation and ICA bulb. No stenosis. Cervical ICA widely patent. Vertebral arteries: Both vertebral artery origins are widely patent. Both vertebral arteries are widely patent through the cervical region to the foramen magnum. Skeleton: Mild ordinary cervical spondylosis. Other neck: No mass or lymphadenopathy. Upper chest: Negative Review of the MIP images confirms the above findings CTA HEAD FINDINGS Anterior circulation: Both internal carotid arteries patent through the skull base and siphon regions. Ordinary siphon atherosclerosis without stenosis. Anterior and middle cerebral vessels are patent without proximal stenosis, aneurysm or vascular malformation. No large or medium vessel occlusion. Posterior circulation: Both vertebral arteries are widely patent at the foramen magnum. There is atherosclerotic disease of the distal vertebral arteries and the basilar artery with areas of dolichoectasia. Mild narrowing of the right V4 segment in 2 locations but without stenosis greater than 30%. No basilar stenosis. Posterior circulation branch vessels show flow. No significant stenosis. Venous sinuses: Patent and normal. Anatomic variants: None significant. Review of the MIP images confirms the above findings IMPRESSION: 1. No large or medium vessel occlusion. 2. Aortic atherosclerosis. 3. Mild atherosclerotic disease at both carotid bifurcations but without stenosis. 4. Atherosclerotic disease of the distal vertebral arteries and the basilar artery with areas of dolichoectasia. Mild narrowing of the right V4 segment in 2 locations but without stenosis greater than 30%. No basilar stenosis. Posterior circulation branch vessels show flow. Aortic Atherosclerosis (ICD10-I70.0). These results were communicated to Dr. Sarita Haver At  5:25 pmon 8/31/2021by text page via the [facility] messaging system. Electronically Signed   By: Nelson Chimes M.D.   On: 02/11/2020 17:26   CT ANGIO NECK CODE STROKE  Result Date: 02/11/2020 CLINICAL DATA:  Acute onset memory loss. EXAM: CT ANGIOGRAPHY HEAD AND NECK TECHNIQUE: Multidetector CT imaging of the head and neck was performed using the standard protocol during bolus administration of intravenous contrast. Multiplanar CT  image reconstructions and MIPs were obtained to evaluate the vascular anatomy. Carotid stenosis measurements (when applicable) are obtained utilizing NASCET criteria, using the distal internal carotid diameter as the denominator. CONTRAST:  30mL OMNIPAQUE IOHEXOL 350 MG/ML SOLN COMPARISON:  Head CT same day FINDINGS: CTA NECK FINDINGS Aortic arch: Aortic atherosclerosis. Branching pattern is normal without origin stenosis. Right carotid system: Common carotid artery widely patent to the bifurcation. Mild plaque at the bifurcation and ICA bulb but no stenosis. Cervical ICA widely patent. Left carotid system: Common carotid artery widely patent to the bifurcation. Mild plaque at the carotid bifurcation and ICA bulb. No stenosis. Cervical ICA widely patent. Vertebral arteries: Both vertebral artery origins are widely patent. Both vertebral arteries are widely patent through the cervical region to the foramen magnum. Skeleton: Mild ordinary cervical spondylosis. Other neck: No mass or lymphadenopathy. Upper chest: Negative Review of the MIP images confirms the above findings CTA HEAD FINDINGS Anterior circulation: Both internal carotid arteries patent through the skull base and siphon regions. Ordinary siphon atherosclerosis without stenosis. Anterior and middle cerebral vessels are patent without proximal stenosis, aneurysm or vascular malformation. No large or medium vessel occlusion. Posterior circulation: Both vertebral arteries are widely patent at the foramen magnum. There is  atherosclerotic disease of the distal vertebral arteries and the basilar artery with areas of dolichoectasia. Mild narrowing of the right V4 segment in 2 locations but without stenosis greater than 30%. No basilar stenosis. Posterior circulation branch vessels show flow. No significant stenosis. Venous sinuses: Patent and normal. Anatomic variants: None significant. Review of the MIP images confirms the above findings IMPRESSION: 1. No large or medium vessel occlusion. 2. Aortic atherosclerosis. 3. Mild atherosclerotic disease at both carotid bifurcations but without stenosis. 4. Atherosclerotic disease of the distal vertebral arteries and the basilar artery with areas of dolichoectasia. Mild narrowing of the right V4 segment in 2 locations but without stenosis greater than 30%. No basilar stenosis. Posterior circulation branch vessels show flow. Aortic Atherosclerosis (ICD10-I70.0). These results were communicated to Dr. Sarita Haver At 5:25 pmon 8/31/2021by text page via the [facility] messaging system. Electronically Signed   By: Nelson Chimes M.D.   On: 02/11/2020 17:26    Procedures Procedures (including critical care time)  Medications Ordered in ED Medications  sodium chloride flush (NS) 0.9 % injection 3 mL (has no administration in time range)  iohexol (OMNIPAQUE) 350 MG/ML injection 50 mL (50 mLs Intravenous Contrast Given 02/11/20 1714)    ED Course  I have reviewed the triage vital signs and the nursing notes.  Pertinent labs & imaging results that were available during my care of the patient were reviewed by me and considered in my medical decision making (see chart for details).    MDM Rules/Calculators/A&P                          Darreon Lutes is a 69 y.o. male who presents for altered memory.   3:49 PM Nursing requested I come evaluate the patient in triage to make sure he does not need to be a code stroke activation based on his symptoms and timeline.  I cannot assess the patient  and patient's primary complaint is lack of memory making capability and some confusion.  According to the wife, the patient has not had good memory since 2 PM today.  She reports that they had intercourse and he denied any headache related to this.  He then took a shower and after that  around 2 PM was unable to make good memories.  He was forgetting conversation and asking very repetitive questions.  She reports he has not had any speech difficulties, numbness, tingling, weakness, difficulty with gait, or other neurologic complaints.  He does have a family history of strokes and he has a personal history of prostate cancer, factor V Leiden on Coumadin therapy, hypertension, vertigo, and basal cell carcinoma.  He does not have any history of stroke or TIA himself.  On my exam, other than inability to create memories currently, he is not have any focal neurologic deficits aside from some mild finger-nose-finger ataxia bilaterally which he reports is chronic.    Neurology team member Shanon Brow was with me during initial exam and he agreed with holding on code stroke activation.  He did recommend that patient will need admission with MRI, EEG, and other labs ordered.  Given lack of any headache, neurology team did not feel we needed CT at this time.  Patient will be bedded for further work-up and admission after he gets a room.   8:34 PM MRI did not show evidence of stroke.  CTA was also overall reassuring.  I spoke again with neurology who felt that if the patient had improving symptoms, he would likely not need admission and can get EEG as an outpatient and follow-up with neurology.  I reassessed the patient and memory was beginning to improve.  He was asking less repetitive questions per wife.  They would like to go home and do not want to be admitted.  They agree with follow-up with neurology and strict return precautions of any new or worsening symptoms arise.  They had no other questions or concerns and  patient was improving.  Patient discharged in good condition with likely transient global amnesia improving.   Final Clinical Impression(s) / ED Diagnoses Final diagnoses:  Memory loss  Transient global amnesia    Rx / DC Orders ED Discharge Orders    None     Clinical Impression: 1. Memory loss   2. Transient global amnesia     Disposition: Discharge  Condition: Good  I have discussed the results, Dx and Tx plan with the pt(& family if present). He/she/they expressed understanding and agree(s) with the plan. Discharge instructions discussed at great length. Strict return precautions discussed and pt &/or family have verbalized understanding of the instructions. No further questions at time of discharge.    Discharge Medication List as of 02/11/2020  8:45 PM      Follow Up: Deland Pretty, MD [place] [id number] [phone]     [facility] [address]     [address] [place] [address] [place] EMERGENCY DEPARTMENT [address] [id number] mc [place] [phone]       Belen Zwahlen, Gwenyth Allegra, MD 02/12/20 0000

## 2020-02-11 NOTE — ED Notes (Signed)
N.A.X1

## 2020-02-11 NOTE — Consult Note (Signed)
Neurology Consultation  Reason for Consult:  transient chest pain with periods of amnesia Referring Physician: Dr. Sherry Ruffing  CC: Transient periods of amnesia ongoing  History is obtained from: Wife  HPI: Hai Grabe is a 69 y.o. male with history of prostate cancer, hypertension, hypercholesterolemia, DVT, factor V Leiden mutation on Coumadin.  Patient was doing fine until approximately 2:00 today.  Patient's wife states that they had colitis and then took a shower.  After coming out of the shower she noted that he continued to ask the same question.  He was also acting strange such as going to the fridge rater walking away from the refrigerator and then going back to the fridge rater.  While in the waiting room she noted that, he continued to assess a question to which she would answer in approximately 3 to 5 minutes later he had asked the same question.  On consultation patient is alert and able to give his age and full name.  Both ED doctor Dr. Sherry Ruffing and myself introduced ourselves.  When we asked again who we were he could not recall.  Patient had no other localizing or lateralizing abnormalities.  Blood pressure 175/105, temperature 98.6, respiratory 16, pulse 94.   Past Medical History:  Diagnosis Date  . Anticoagulated on Coumadin   . Auditory vertigo involving left ear VIRAL EAR INFECTION IMPROVING  . Compressed vertebrae    YRS AGO--  LUMBAR  . Factor V Leiden mutation ([place])    MONTIORED BY DR Sanderson  . History of basal cell carcinoma excision   . History of DVT of lower extremity RIGHT /NOV 2011   and right groin  . History of kidney stones   . History of phlebitis NOV 2011  . Hypercholesterolemia    improved with diet, not taking medication  . Hypertension   . Incomplete RBBB   . Organic impotence   . Pneumonia   . Prostate cancer ([place]) 06/25/2012   Adenocarcinoma,gleason=3+3=6,PSA=3.8,volume=44cc    Family History  Problem Relation Age of Onset  .  Cancer Father        brian tumor, gliobalstoma   Social History:   reports that he has never smoked. He has never used smokeless tobacco. He reports current alcohol use. He reports that he does not use drugs.  Medications  Current Facility-Administered Medications:  .  sodium chloride flush (NS) 0.9 % injection 3 mL, 3 mL, Intravenous, Once, Tegeler, Gwenyth Allegra, MD  Current Outpatient Medications:  .  atorvastatin (LIPITOR) 10 MG tablet, Take 10 mg by mouth daily., Disp: , Rfl:  .  enoxaparin (LOVENOX) 120 MG/0.8ML injection, Inject 110 mg into the skin. Starts 05-17-18 and stops day prior to surgery 05-21-18, Disp: , Rfl:  .  hydrochlorothiazide (HYDRODIURIL) 25 MG tablet, Take 25 mg by mouth daily., Disp: , Rfl:  .  HYDROcodone-acetaminophen (NORCO) 5-325 MG tablet, Take 1 tablet by mouth every 6 (six) hours as needed for moderate pain., Disp: 8 tablet, Rfl: 0 .  Magnesium 400 MG CAPS, Take 1 capsule by mouth every other day., Disp: , Rfl:  .  Multiple Vitamin (MULTIVITAMIN) capsule, Take 1 capsule by mouth daily. , Disp: , Rfl:  .  telmisartan (MICARDIS) 80 MG tablet, Take 80 mg by mouth daily., Disp: , Rfl:  .  vitamin C (ASCORBIC ACID) 500 MG tablet, Take 500 mg by mouth daily., Disp: , Rfl:  .  warfarin (COUMADIN) 5 MG tablet, Take 5 mg by mouth daily. , Disp: ,  Rfl:   ROS:   General ROS: negative for - chills, fatigue, fever, night sweats, weight gain or weight loss Psychological ROS: Positive for -transient periods of amnesia. Ophthalmic ROS: negative for - blurry vision, double vision, eye pain or loss of vision ENT ROS: negative for - epistaxis, nasal discharge, oral lesions, sore throat, tinnitus or vertigo Allergy and Immunology ROS: negative for - hives or itchy/watery eyes Hematological and Lymphatic ROS: negative for - bleeding problems, bruising or swollen lymph nodes Endocrine ROS: negative for - galactorrhea, hair pattern changes, polydipsia/polyuria or temperature  intolerance Respiratory ROS: negative for - cough, hemoptysis, shortness of breath or wheezing Cardiovascular ROS: negative for - chest pain, dyspnea on exertion, edema or irregular heartbeat Gastrointestinal ROS: negative for - abdominal pain, diarrhea, hematemesis, nausea/vomiting or stool incontinence Genito-Urinary ROS: negative for - dysuria, hematuria, incontinence or urinary frequency/urgency Musculoskeletal ROS: negative for - joint swelling or muscular weakness Neurological ROS: as noted in HPI Dermatological ROS: negative for rash and skin lesion changes  Exam: Current vital signs: BP (!) 175/105   Pulse 94   Temp 98.6 F (37 C) (Oral)   Resp 16   Ht 6\' 2"  (1.88 m)   Wt 99.8 kg   SpO2 99%   BMI 28.25 kg/m  Vital signs in last 24 hours: Temp:  [98.6 F (37 C)] 98.6 F (37 C) (08/31 1537) Pulse Rate:  [94] 94 (08/31 1537) Resp:  [16] 16 (08/31 1537) BP: (175)/(105) 175/105 (08/31 1537) SpO2:  [99 %] 99 % (08/31 1537) Weight:  [99.8 kg] 99.8 kg (08/31 1537)   Constitutional: Appears well-developed and well-nourished.  Psych: Affect appropriate to situation Eyes: No scleral injection HENT: No OP obstrucion Head: Normocephalic.  Cardiovascular: Normal rate and regular rhythm.  Respiratory: Effort normal, non-labored breathing GI: Soft.  No distension. There is no tenderness.  Skin: WDI  Neuro: Mental Status: Patient is awake, alert, oriented to person, place.  Patient will be told while he is here, who the practitioners are however 5 minutes later he will forget.  He is able to follow commands without difficulty.  Speech is clear with no dysarthria or aphasia.  Patient does not recall the events that his wife had to be give the history Cranial Nerves: II: Visual Fields are full.  III,IV, VI: EOMI without ptosis or diploplia. Pupils equal, round and reactive to light V: Facial sensation is symmetric to temperature VII: Facial movement is symmetric.  VIII: hearing  is intact to voice X: Palat elevates symmetrically XI: Shoulder shrug is symmetric. XII: tongue is midline without atrophy or fasciculations.  Motor: Tone is normal. Bulk is normal. 5/5 strength was present in all four extremities.  Sensory: Sensation is symmetric to light touch and temperature in the arms and legs. DSS Deep Tendon Reflexes: 2+ and symmetric in the biceps and patellae.  Plantars: Toes are downgoing bilaterally.  Cerebellar: FNF and HKS are intact bilaterally  Labs I have reviewed labs in epic and the results pertinent to this consultation are:   CBC    Component Value Date/Time   WBC 6.0 11/06/2012 0803   RBC 4.64 11/06/2012 0803   HGB 14.5 11/06/2012 0803   HCT 40.2 11/06/2012 0803   PLT 264 11/06/2012 0803   MCV 86.6 11/06/2012 0803   MCH 31.3 11/06/2012 0803   MCHC 36.1 (H) 11/06/2012 0803   RDW 12.6 11/06/2012 0803    CMP     Component Value Date/Time   NA 141 10/01/2014 1828  K 3.6 10/01/2014 1828   CL 104 10/01/2014 1828   CO2 25 10/01/2014 1828   GLUCOSE 128 (H) 10/01/2014 1828   BUN 21 10/01/2014 1828   CREATININE 1.21 10/01/2014 1828   CALCIUM 9.4 10/01/2014 1828   PROT 6.5 11/06/2012 0803   ALBUMIN 3.6 11/06/2012 0803   AST 24 11/06/2012 0803   ALT 33 11/06/2012 0803   ALKPHOS 54 11/06/2012 0803   BILITOT 0.4 11/06/2012 0803   GFRNONAA 62 (L) 10/01/2014 1828   GFRAA 72 (L) 10/01/2014 1828    Lipid Panel  No results found for: CHOL, TRIG, HDL, CHOLHDL, VLDL, LDLCALC, LDLDIRECT   Imaging I have reviewed the images obtained:  CT-scan of the brain-to be obtained  MRI examination of the brain-to be obtained   CTA head and neck-to be obtained  Etta Quill PA-C [facility] [phone]  M-F  (9:00 am- 5:00 PM)  02/11/2020, 4:03 PM     Assessment:  This is a 69 year old male presented to the hospital with sudden onset of transient periods of forgetfulness.  Wife noted that he would continue to ask the same  questions after about 3 to 5 minutes.  Exam otherwise was negative.  Exam was very classic of transient global amnesia.  However, due to his history of DVT, basal cell carcinoma, factor V Leiden mutation and being on Coumadin we will also obtain CTA of head and neck, MRI brain, EEG.  Impression: -Likely TGA -We will rule out any form of stroke or TIA Recommendations: -EEG -MRI brain -CTA of head and neck -Check INR

## 2020-02-11 NOTE — Discharge Instructions (Signed)
Your history, exam, and workup today are consistent with Transient Global Amnesia as we discussed. We had a shared decision making conversation about admission for EEG however given your improving symptoms, we feel it is reasonable to go home tonight and follow up with outpatient neurology. If any symptoms worsen or new symptoms develop, please return to the nearest Emergency Department.

## 2020-02-11 NOTE — ED Triage Notes (Signed)
Patient in with wife. LKW 1400. Per wife patient had sudden onset of AMS while making lunch at home. She said he forgot what he was doing while attempting to make lunch. No neuro deficits at this time.  EDP and neurology in triage to see patient.

## 2020-02-11 NOTE — ED Provider Notes (Signed)
3:49 PM Nursing requested I come evaluate the patient in triage to make sure he does not need to be a code stroke activation based on his symptoms and timeline.  I cannot assess the patient and patient's primary complaint is lack of memory making capability and some confusion.  According to the wife, the patient has not had good memory since 2 PM today.  She reports that they had intercourse and he denied any headache related to this.  He then took a shower and after that around 2 PM was unable to make good memories.  He was forgetting conversation and asking very repetitive questions.  She reports he has not had any speech difficulties, numbness, tingling, weakness, difficulty with gait, or other neurologic complaints.  He does have a family history of strokes and he has a personal history of prostate cancer, factor V Leiden on Coumadin therapy, hypertension, vertigo, and basal cell carcinoma.  He does not have any history of stroke or TIA himself.  On my exam, other than inability to create memories currently, he is not have any focal neurologic deficits aside from some mild finger-nose-finger ataxia bilaterally which he reports is chronic.    Neurology team member Shanon Brow was with me during initial exam and he agreed with holding on code stroke activation.  He did recommend that patient will need admission with MRI, EEG, and other labs ordered.  Given lack of any headache, neurology team did not feel we needed CT at this time.  Patient will be bedded for further work-up and admission after he gets a room.      Siyon Linck, Gwenyth Allegra, MD 02/11/20 [phone]

## 2020-02-13 DIAGNOSIS — I10 Essential (primary) hypertension: Secondary | ICD-10-CM | POA: Diagnosis not present

## 2020-02-13 DIAGNOSIS — G454 Transient global amnesia: Secondary | ICD-10-CM | POA: Diagnosis not present

## 2020-02-14 DIAGNOSIS — Z20822 Contact with and (suspected) exposure to covid-19: Secondary | ICD-10-CM | POA: Diagnosis not present

## 2020-02-18 ENCOUNTER — Other Ambulatory Visit: Payer: Self-pay

## 2020-02-18 ENCOUNTER — Encounter: Payer: Self-pay | Admitting: Neurology

## 2020-02-18 ENCOUNTER — Ambulatory Visit (INDEPENDENT_AMBULATORY_CARE_PROVIDER_SITE_OTHER): Payer: Medicare Other | Admitting: Neurology

## 2020-02-18 VITALS — BP 136/83 | HR 55 | Ht 74.0 in | Wt 220.0 lb

## 2020-02-18 DIAGNOSIS — Z1329 Encounter for screening for other suspected endocrine disorder: Secondary | ICD-10-CM | POA: Diagnosis not present

## 2020-02-18 DIAGNOSIS — G454 Transient global amnesia: Secondary | ICD-10-CM

## 2020-02-18 DIAGNOSIS — R7989 Other specified abnormal findings of blood chemistry: Secondary | ICD-10-CM | POA: Diagnosis not present

## 2020-02-18 DIAGNOSIS — E079 Disorder of thyroid, unspecified: Secondary | ICD-10-CM | POA: Diagnosis not present

## 2020-02-18 DIAGNOSIS — R799 Abnormal finding of blood chemistry, unspecified: Secondary | ICD-10-CM | POA: Diagnosis not present

## 2020-02-18 DIAGNOSIS — E538 Deficiency of other specified B group vitamins: Secondary | ICD-10-CM | POA: Diagnosis not present

## 2020-02-18 NOTE — Progress Notes (Signed)
Chief Complaint  Patient presents with  . New Patient (Initial Visit)    MMSE - 13 animals. He is here with his wife, Jamas Lav, for ER follow up of transient global amnesia. Reports a sudden onset of memory loss lasting several hours before it started to improve. He does not feel he has fully returned to baseline yet. Hx of migraines.  Marland Kitchen PCP    Deland Pretty, MD (referred from hospital)    HISTORICAL  Craig Booth is a 69 year old male, seen in request by his primary care physician Dr. Deland Pretty for evaluation of sudden onset memory loss on February 11, 2020   I reviewed and summarized the referring note. Hypertension Factor V Leyden mutation, on chronic Xarelto treatment History of kidney stone Prostate cancer  I reviewed the record brought in by patient, he had sudden onset of memory loss on February 11, 2020 around 1 PM, while watching their daily show jeopardy, during conversation, his wife realized that patient could not remember the previous Donna Christen has diet, his wife also noticed that patient has slower reaction time, taking much longer to put on his cloth to get himself ready for the hospital visit, he could not remember that they have moved his daughter recently, he tended to repeat questions, repetitively taking out his cell phone and put it back again, looking confused  His memory gradually came back around 7 PM while he was having MRI of brain, by 8pm when he was discharged from the hospital the same day, he had significant recovery, he described his typical migraine headache that night, lasting 3 to 4 hours, by the time he went to bed at midnight, his memory most came back.   Patient described intermittent subtle changes over the past few days, but wife denies any difference.  Personally reviewed MRI of the brain without contrast that was essentially normal, CT angiogram of head and neck showed mild bilateral carotid and intracranial atherosclerotic disease, no  significant large vessel disease  Laboratory evaluation showed negative UDS, CBC, CMP,  He has strong family history of memory loss, father, paternal uncle suffered dementia,  He is a retired English as a second language teacher, very mentally, and physically active in his daily activity, there was no significant memory loss,   He had a history of recurrent right lower extremity DVT, found to have factor V Leyden mutation, has been treated with long-term anticoagulation for over 20 years, previously on Coumadin, now on Xarelto  Reported few years history of intermittent diplopia, was seen by ophthalmologist, told to have underlying misalignment, reported acetylcholine receptor antibody was negative REVIEW OF SYSTEMS: Full 14 system review of systems performed and notable only for as above All other review of systems were negative.  ALLERGIES: Allergies  Allergen Reactions  . Betadine [Povidone Iodine] Other (See Comments)    Eye burning after eye surgery  . Amoxicillin Hives and Other (See Comments)    Itching  . Bee Venom     HOME MEDICATIONS: Current Outpatient Medications  Medication Sig Dispense Refill  . atorvastatin (LIPITOR) 10 MG tablet Take 10 mg by mouth daily.    . Magnesium 400 MG CAPS Take 1 capsule by mouth every other day.    . Multiple Vitamin (MULTIVITAMIN) capsule Take 1 capsule by mouth daily.     . tadalafil (CIALIS) 5 MG tablet Take 5 mg by mouth as needed for erectile dysfunction.    Marland Kitchen telmisartan (MICARDIS) 40 MG tablet Take 40 mg by mouth daily.    Marland Kitchen  vitamin C (ASCORBIC ACID) 500 MG tablet Take 500 mg by mouth daily.    Alveda Reasons 20 MG TABS tablet Take 20 mg by mouth daily.     No current facility-administered medications for this visit.    PAST MEDICAL HISTORY: Past Medical History:  Diagnosis Date  . Anticoagulated on Coumadin   . Auditory vertigo involving left ear VIRAL EAR INFECTION IMPROVING  . Compressed vertebrae    YRS AGO--  LUMBAR  . Factor V Leiden  mutation ([place])    MONTIORED BY DR [place]  . History of basal cell carcinoma excision   . History of DVT of lower extremity RIGHT /NOV 2011   and right groin  . History of kidney stones   . History of phlebitis NOV 2011  . Hypercholesterolemia    improved with diet, not taking medication  . Hypertension   . Incomplete RBBB   . Migraine   . Organic impotence   . Pneumonia   . Prostate cancer ([place]) 06/25/2012   Adenocarcinoma,gleason=3+3=6,PSA=3.8,volume=44cc  . Transient global amnesia     PAST SURGICAL HISTORY: Past Surgical History:  Procedure Laterality Date  . COLONOSCOPY  2012  . CYSTOSCOPY N/A 11/08/2012   Procedure: CYSTOSCOPY FLEXIBLE;  Surgeon: Malka So, MD;  [facility];  Service: Urology;  Laterality: N/A;  NO SEEDS FOUND IN BLADDER  . CYSTOSCOPY WITH RETROGRADE PYELOGRAM, URETEROSCOPY AND STENT PLACEMENT Right 10/01/2014   Procedure: CYSTOSCOPY WITH RETROGRADE PYELOGRAM, URETEROSCOPY, STONE REMOVAL, AND STENT PLACEMENT;  Surgeon: Raynelle Bring, MD;  Location: [facility];  Service: Urology;  Laterality: Right;  . HOLMIUM LASER APPLICATION Right 8/41/6606   Procedure: HOLMIUM LASER APPLICATION;  Surgeon: Raynelle Bring, MD;  Location: [facility];  Service: Urology;  Laterality: Right;  . KIDNEY STONE SURGERY    . ORCHIECTOMY Right 05/22/2018   Procedure: ORCHIECTOMY, RADICAL;  Surgeon: Irine Seal, MD;  Location: [facility];  Service: Urology;  Laterality: Right;  . PROSTATE BIOPSY  06/25/2012   Adenocarcinoma  . RADIOACTIVE SEED IMPLANT N/A 11/08/2012   Procedure:  34 RADIOACTIVE SEED IMPLANT;  Surgeon: Malka So, MD;  Location: [facility];  Service: Urology;  Laterality: N/A;      SEEDS IMPLANTED  . TONSILLECTOMY  AGE 29  . UMBILICAL HERNIA REPAIR  02/10/2011    FAMILY HISTORY: Family History  Problem Relation Age of Onset  . Cancer Father        brian tumor, gliobalstoma  . Dementia Father   . Dementia Mother      SOCIAL HISTORY: Social History   Socioeconomic History  . Marital status: Married    Spouse name: Not on file  . Number of children: 3  . Years of education: college  . Highest education level: Not on file  Occupational History  . Occupation: Retired    Fish farm manager: Restaurant manager, fast food    Comment: [employer]  Tobacco Use  . Smoking status: Never Smoker  . Smokeless tobacco: Never Used  Vaping Use  . Vaping Use: Never used  Substance and Sexual Activity  . Alcohol use: Not Currently  . Drug use: No  . Sexual activity: Not Currently  Other Topics Concern  . Not on file  Social History Narrative   Lives at home with his wife.   Ambidextrous.   No daily caffeine use.   Social Determinants of Health   Financial Resource Strain:   . Difficulty of Paying Living Expenses: Not on file  Food Insecurity:   .  Worried About Charity fundraiser in the Last Year: Not on file  . Ran Out of Food in the Last Year: Not on file  Transportation Needs:   . Lack of Transportation (Medical): Not on file  . Lack of Transportation (Non-Medical): Not on file  Physical Activity:   . Days of Exercise per Week: Not on file  . Minutes of Exercise per Session: Not on file  Stress:   . Feeling of Stress : Not on file  Social Connections:   . Frequency of Communication with Friends and Family: Not on file  . Frequency of Social Gatherings with Friends and Family: Not on file  . Attends Religious Services: Not on file  . Active Member of Clubs or Organizations: Not on file  . Attends Archivist Meetings: Not on file  . Marital Status: Not on file  Intimate Partner Violence:   . Fear of Current or Ex-Partner: Not on file  . Emotionally Abused: Not on file  . Physically Abused: Not on file  . Sexually Abused: Not on file     PHYSICAL EXAM   Vitals:   02/18/20 0751  BP: 136/83  Pulse: (!) 55  Weight: 220 lb (99.8 kg)  Height: 6\' 2"  (1.88 m)   Not recorded     Body mass index is  28.25 kg/m.  PHYSICAL EXAMNIATION:  Gen: NAD, conversant, well nourised, well groomed                     Cardiovascular: Regular rate rhythm, no peripheral edema, warm, nontender. Eyes: Conjunctivae clear without exudates or hemorrhage Neck: Supple, no carotid bruits. Pulmonary: Clear to auscultation bilaterally   NEUROLOGICAL EXAM:  MENTAL STATUS: MMSE - Mini Mental State Exam 02/18/2020  Orientation to time 5  Orientation to Place 5  Registration 3  Attention/ Calculation 5  Recall 3  Language- name 2 objects 2  Language- repeat 1  Language- follow 3 step command 3  Language- read & follow direction 1  Write a sentence 1  Copy design 1  Total score 30  animal naming 15 CRANIAL NERVES: CN II: Visual fields are full to confrontation. Pupils are round equal and briskly reactive to light. CN III, IV, VI: extraocular movement are normal. No ptosis. CN V: Facial sensation is intact to light touch CN VII: Face is symmetric with normal eye closure  CN VIII: Hearing is normal to causal conversation. CN IX, X: Phonation is normal. CN XI: Head turning and shoulder shrug are intact  MOTOR: There is no pronator drift of out-stretched arms. Muscle bulk and tone are normal. Muscle strength is normal.  REFLEXES: Reflexes are 2+ and symmetric at the biceps, triceps, knees, and ankles. Plantar responses are flexor.  SENSORY: Intact to light touch, pinprick and vibratory sensation are intact in fingers and toes.  COORDINATION: There is no trunk or limb dysmetria noted.  GAIT/STANCE: Posture is normal. Gait is steady with normal steps, base, arm swing, and turning. Heel and toe walking are normal. Tandem gait is normal.  Romberg is absent.   DIAGNOSTIC DATA (LABS, IMAGING, TESTING) - I reviewed patient records, labs, notes, testing and imaging myself where available.   ASSESSMENT AND PLAN  Craig Booth is a 69 y.o. male   Transient global amnesia February 11, 2020 Strong  family history of dementia History of factor V  Leyden mutation, on chronic anticoagulation, Xarelto  Normal MRI of the brain  Differentiation diagnosis include TIA, versus partial  seizure  Complete evaluation with EEG, echocardiogram,  Continue Xarelto  Also emphasized importance of increased water intake, keep moderate exercise  Laboratory evaluation including TSH, B12 to rule out treatable etiology for memory loss  Return to clinic with nurse practitioner Sarah in 3 4 months   Marcial Pacas, M.D. Ph.D.  [facility] [address] [place], [place] [address] Ph: [phone] Fax: [phone]  CC:  Deland Pretty, MD

## 2020-02-19 LAB — VITAMIN B12: Vitamin B-12: 919 pg/mL (ref 232–1245)

## 2020-02-19 LAB — TSH: TSH: 2.98 u[IU]/mL (ref 0.450–4.500)

## 2020-02-19 LAB — RPR: RPR Ser Ql: NONREACTIVE

## 2020-02-25 DIAGNOSIS — I10 Essential (primary) hypertension: Secondary | ICD-10-CM | POA: Diagnosis not present

## 2020-02-25 DIAGNOSIS — G454 Transient global amnesia: Secondary | ICD-10-CM | POA: Diagnosis not present

## 2020-03-04 IMAGING — CR DG CHEST 2V
2 series · 2 of 2 positions shown · non-contrast
Comparison: 09/18/2015, 09/14/2012, 05/13/2010.

CLINICAL DATA: Right testicular malignancy.  Preop study.

EXAM:
CHEST - 2 VIEW

[w chest pa]
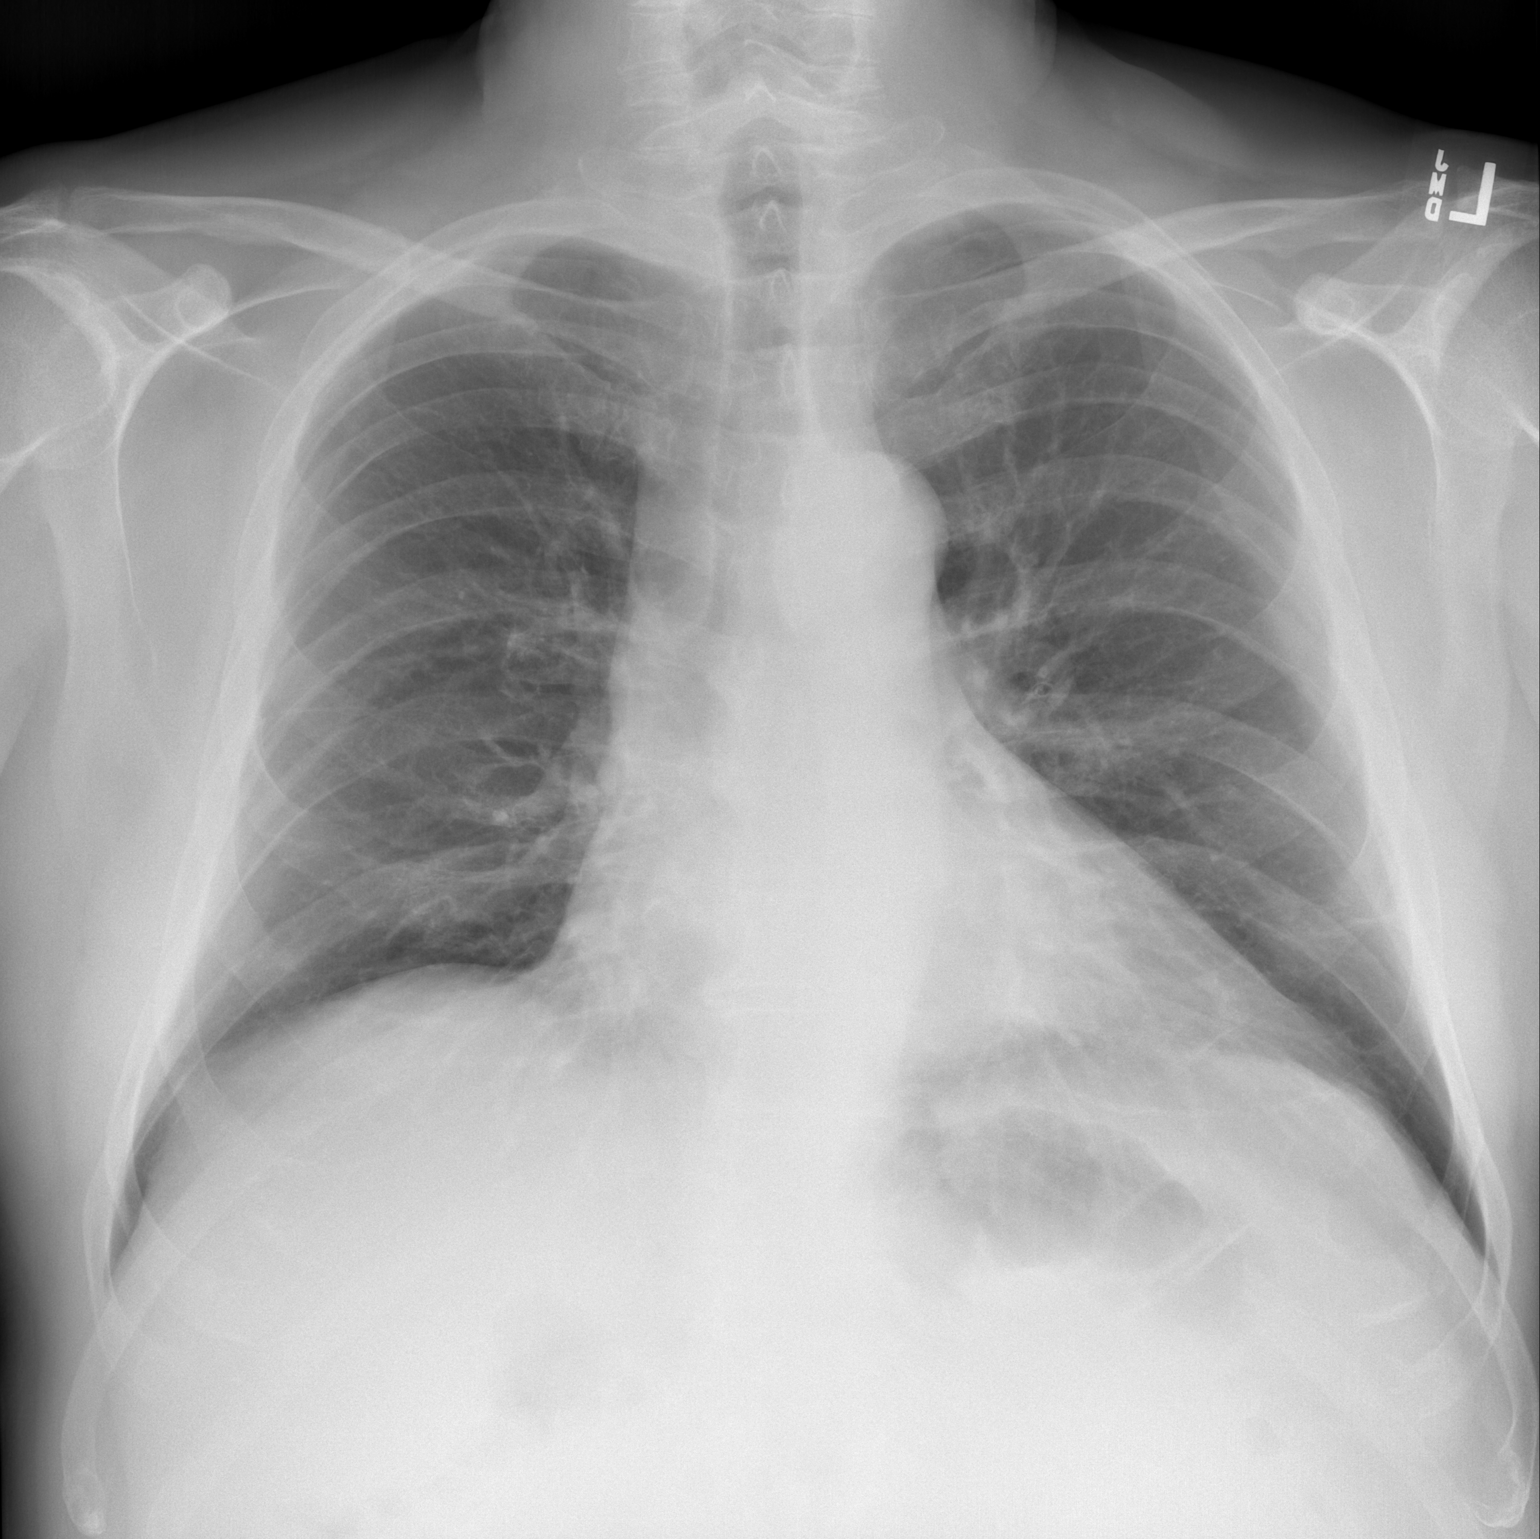

[w chest lat]
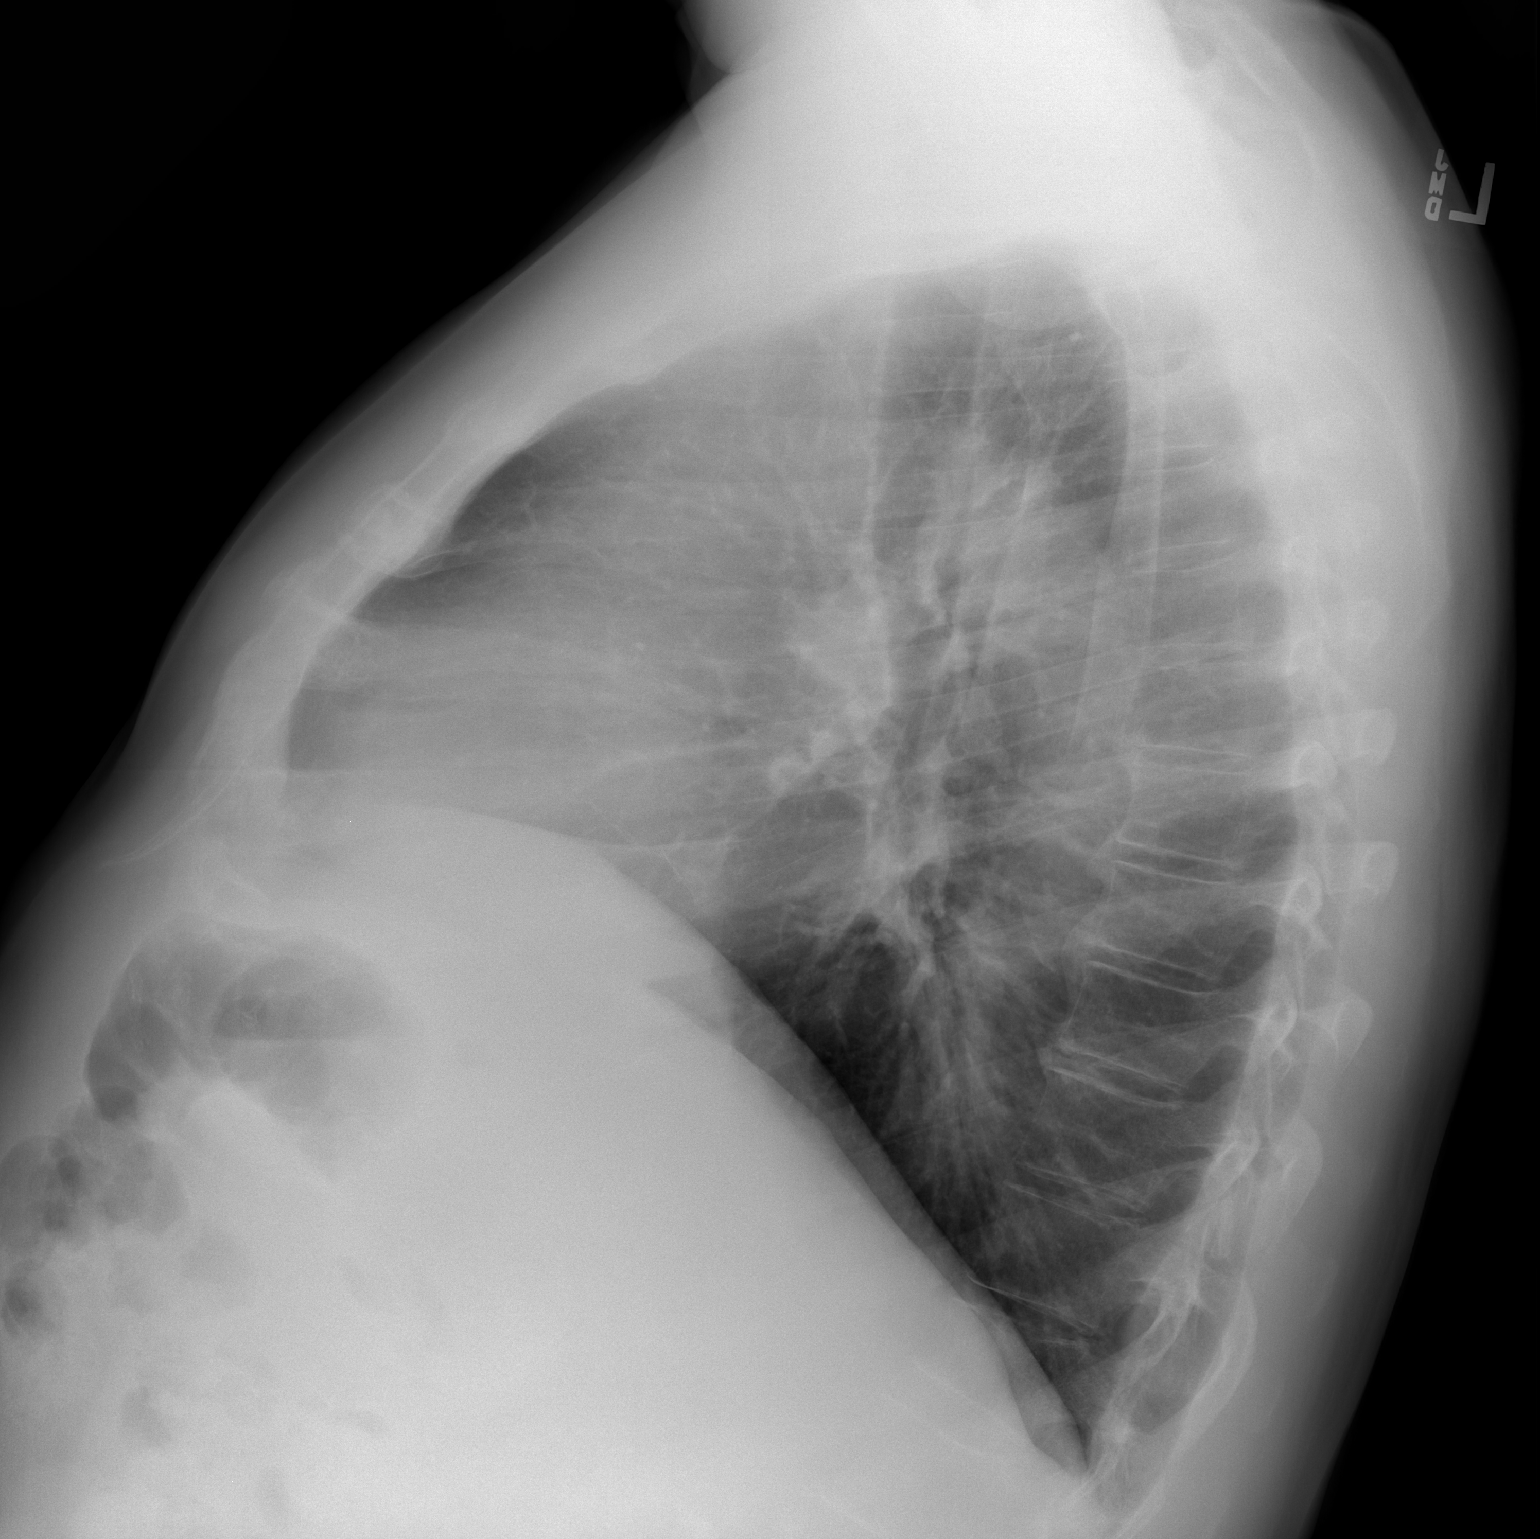

[2 of 2 positions shown; findings below may reference images not displayed]

FINDINGS: Mediastinum and hilar structures normal. Lungs are clear of acute
infiltrates. Questionable tiny nodule over the right lung base,
possibly a nipple shadow. Repeat PA and lateral chest x-ray with
nipple markers suggested for further evaluation. No pleural effusion
or pneumothorax.
IMPRESSION: 1. Questionable tiny nodule over the right lung base, possibly a
nipple shadow. Repeat PA and lateral chest x-ray with nipple markers
suggested for further evaluation.

2. Cardiomegaly. No pulmonary venous congestion. No acute pulmonary
infiltrate.

## 2020-03-07 IMAGING — CR DG CHEST 1V
1 series · 1 of 1 positions shown · non-contrast
Comparison: 05/28/2018

CLINICAL DATA: Repeat chest x-ray with nipple markers

EXAM:
CHEST  1 VIEW

[w chest pa]
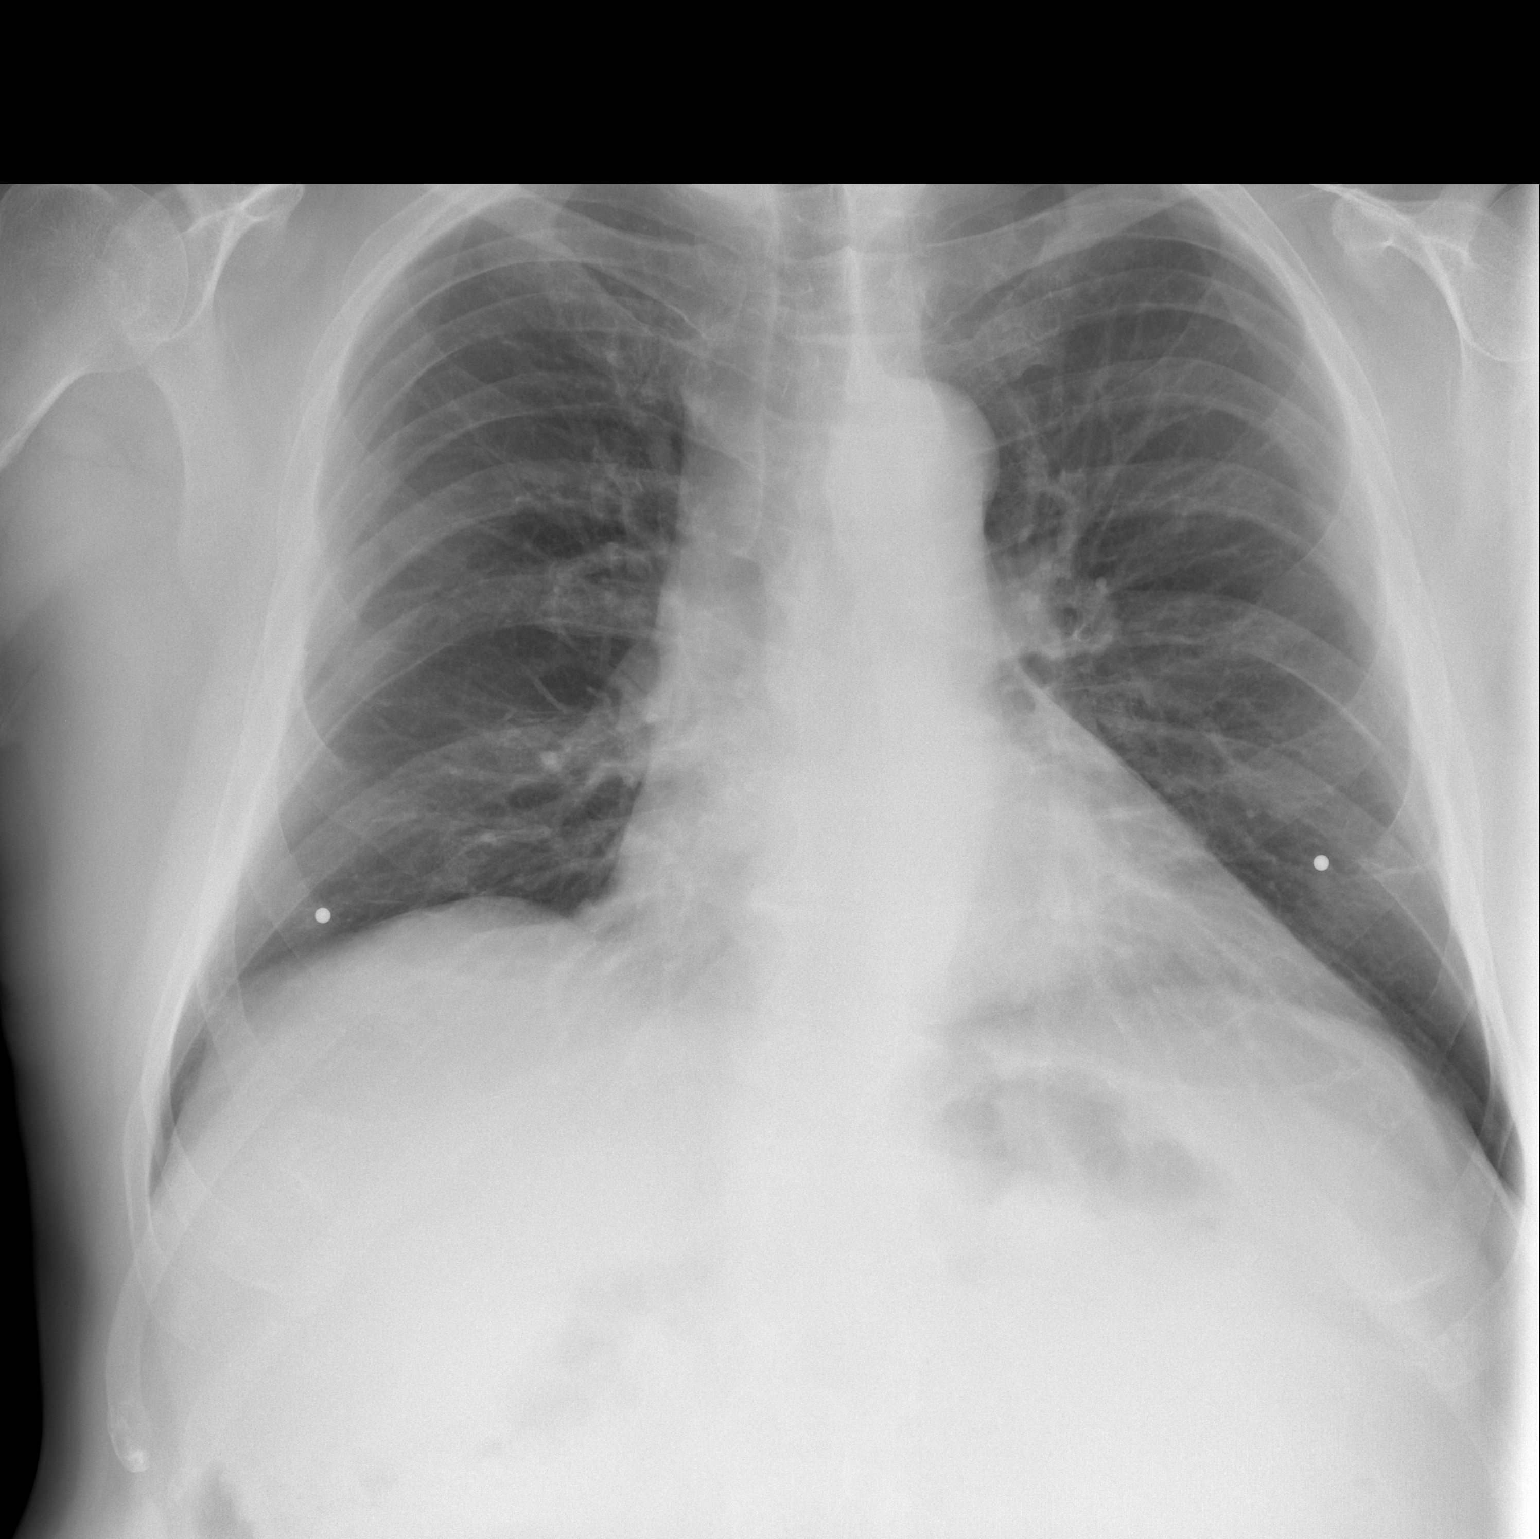

[1 of 1 positions shown; findings below may reference images not displayed]

FINDINGS: Normal heart size. Low lung volumes. Subsegmental atelectasis at the
left base. Nipple markers are in place. There correspond to the
nodular densities. These are therefore nipple shadows. No true
pulmonary nodules.
IMPRESSION: The nodular densities correspond to the nipple markers and are
therefore nipple shadows. No worrisome pulmonary nodules are
present. Left basilar subsegmental atelectasis.

## 2020-03-23 ENCOUNTER — Other Ambulatory Visit: Payer: Self-pay

## 2020-03-23 ENCOUNTER — Ambulatory Visit (INDEPENDENT_AMBULATORY_CARE_PROVIDER_SITE_OTHER): Payer: Medicare Other | Admitting: Neurology

## 2020-03-23 DIAGNOSIS — G454 Transient global amnesia: Secondary | ICD-10-CM

## 2020-03-23 DIAGNOSIS — R410 Disorientation, unspecified: Secondary | ICD-10-CM

## 2020-03-24 NOTE — Procedures (Signed)
   HISTORY: 69 year old male with history of sudden memory loss  TECHNIQUE:  This is a routine 16 channel EEG recording with one channel devoted to a limited EKG recording.  It was performed during wakefulness, drowsiness and asleep.  Hyperventilation and photic stimulation were performed as activating procedures.  There are minimum muscle and movement artifact noted.  Upon maximum arousal, posterior dominant waking rhythm consistent of rhythmic alpha range activity, with frequency of 11 hz. Activities are symmetric over the bilateral posterior derivations and attenuated with eye opening.  Hyperventilation produced mild/moderate buildup with higher amplitude and the slower activities noted.  Photic stimulation did not alter the tracing.  During EEG recording, patient developed drowsiness and no deeper stage of sleep was achieved  During EEG recording, there was no epileptiform discharge noted.  EKG demonstrate sinus rhythm, with heart rate of 60 bpm  CONCLUSION: This is a  normal awake EEG.  There is no electrodiagnostic evidence of epileptiform discharge.  Marcial Pacas, M.D. Ph.D.  [facility] [place], [place] [address] Phone: [phone] Fax:      [phone]

## 2020-03-30 DIAGNOSIS — L821 Other seborrheic keratosis: Secondary | ICD-10-CM | POA: Diagnosis not present

## 2020-03-30 DIAGNOSIS — L814 Other melanin hyperpigmentation: Secondary | ICD-10-CM | POA: Diagnosis not present

## 2020-03-30 DIAGNOSIS — C4441 Basal cell carcinoma of skin of scalp and neck: Secondary | ICD-10-CM | POA: Diagnosis not present

## 2020-03-30 DIAGNOSIS — Z85828 Personal history of other malignant neoplasm of skin: Secondary | ICD-10-CM | POA: Diagnosis not present

## 2020-03-30 DIAGNOSIS — D225 Melanocytic nevi of trunk: Secondary | ICD-10-CM | POA: Diagnosis not present

## 2020-03-30 DIAGNOSIS — D2262 Melanocytic nevi of left upper limb, including shoulder: Secondary | ICD-10-CM | POA: Diagnosis not present

## 2020-03-30 DIAGNOSIS — D2261 Melanocytic nevi of right upper limb, including shoulder: Secondary | ICD-10-CM | POA: Diagnosis not present

## 2020-03-30 DIAGNOSIS — L570 Actinic keratosis: Secondary | ICD-10-CM | POA: Diagnosis not present

## 2020-03-30 DIAGNOSIS — D0462 Carcinoma in situ of skin of left upper limb, including shoulder: Secondary | ICD-10-CM | POA: Diagnosis not present

## 2020-03-31 ENCOUNTER — Telehealth: Payer: Self-pay | Admitting: Neurology

## 2020-03-31 NOTE — Telephone Encounter (Signed)
I called [facility] billing dept [phone]) and spoke to [place]. She informed that the vitamin B12 and TSH labs were denied by Medicare. They needed additional ICD10 codes. They were provided with the issues trying to be ruled out (R79.9, R79.89, E53.8). They are going to re-file the claim with Medicare. The patient can disregard the current bill he received. The new claim will take 30-45 days to process. There should be no further issues. I also called the patient and provided him with this information.

## 2020-03-31 NOTE — Telephone Encounter (Signed)
Pt has called to report that labs done on 09-07 by [facility] were not properly submitted to Medicare and as a result of that Medicare has denied the claim.  Pt is asking if the claim can be properly resubmitted so Medicare will pay their portion for the Lab work.

## 2020-04-15 ENCOUNTER — Ambulatory Visit: Payer: Medicare Other | Admitting: Neurology

## 2020-04-23 DIAGNOSIS — Z23 Encounter for immunization: Secondary | ICD-10-CM | POA: Diagnosis not present

## 2020-05-14 DIAGNOSIS — F439 Reaction to severe stress, unspecified: Secondary | ICD-10-CM | POA: Diagnosis not present

## 2020-05-14 DIAGNOSIS — I10 Essential (primary) hypertension: Secondary | ICD-10-CM | POA: Diagnosis not present

## 2020-05-15 DIAGNOSIS — H31092 Other chorioretinal scars, left eye: Secondary | ICD-10-CM | POA: Diagnosis not present

## 2020-05-15 DIAGNOSIS — H43813 Vitreous degeneration, bilateral: Secondary | ICD-10-CM | POA: Diagnosis not present

## 2020-05-15 DIAGNOSIS — H2512 Age-related nuclear cataract, left eye: Secondary | ICD-10-CM | POA: Diagnosis not present

## 2020-05-15 DIAGNOSIS — H43393 Other vitreous opacities, bilateral: Secondary | ICD-10-CM | POA: Diagnosis not present

## 2020-05-23 ENCOUNTER — Other Ambulatory Visit: Payer: Self-pay

## 2020-05-23 ENCOUNTER — Encounter (HOSPITAL_COMMUNITY): Payer: Self-pay

## 2020-05-23 ENCOUNTER — Emergency Department (HOSPITAL_COMMUNITY)
Admission: EM | Admit: 2020-05-23 | Discharge: 2020-05-23 | Disposition: A | Discharge status: Home / Self Care | Payer: Medicare Other | Attending: Emergency Medicine | Admitting: Emergency Medicine

## 2020-05-23 ENCOUNTER — Emergency Department (HOSPITAL_COMMUNITY): Payer: Medicare Other

## 2020-05-23 DIAGNOSIS — W07XXXA Fall from chair, initial encounter: Secondary | ICD-10-CM | POA: Diagnosis not present

## 2020-05-23 DIAGNOSIS — R58 Hemorrhage, not elsewhere classified: Secondary | ICD-10-CM | POA: Diagnosis not present

## 2020-05-23 DIAGNOSIS — S0101XA Laceration without foreign body of scalp, initial encounter: Secondary | ICD-10-CM | POA: Insufficient documentation

## 2020-05-23 DIAGNOSIS — Y92009 Unspecified place in unspecified non-institutional (private) residence as the place of occurrence of the external cause: Secondary | ICD-10-CM | POA: Diagnosis not present

## 2020-05-23 DIAGNOSIS — R231 Pallor: Secondary | ICD-10-CM | POA: Diagnosis not present

## 2020-05-23 DIAGNOSIS — R110 Nausea: Secondary | ICD-10-CM | POA: Diagnosis not present

## 2020-05-23 DIAGNOSIS — S0990XA Unspecified injury of head, initial encounter: Secondary | ICD-10-CM

## 2020-05-23 DIAGNOSIS — W19XXXA Unspecified fall, initial encounter: Secondary | ICD-10-CM | POA: Diagnosis not present

## 2020-05-23 DIAGNOSIS — R42 Dizziness and giddiness: Secondary | ICD-10-CM | POA: Diagnosis not present

## 2020-05-23 DIAGNOSIS — Z7901 Long term (current) use of anticoagulants: Secondary | ICD-10-CM | POA: Insufficient documentation

## 2020-05-23 LAB — CBC WITH DIFFERENTIAL/PLATELET
Abs Immature Granulocytes: 0.02 10*3/uL (ref 0.00–0.07)
Basophils Absolute: 0 10*3/uL (ref 0.0–0.1)
Basophils Relative: 0 %
Eosinophils Absolute: 0.1 10*3/uL (ref 0.0–0.5)
Eosinophils Relative: 2 %
HCT: 42.9 % (ref 39.0–52.0)
Hemoglobin: 14.5 g/dL (ref 13.0–17.0)
Immature Granulocytes: 0 %
Lymphocytes Relative: 35 %
Lymphs Abs: 2.4 10*3/uL (ref 0.7–4.0)
MCH: 31.6 pg (ref 26.0–34.0)
MCHC: 33.8 g/dL (ref 30.0–36.0)
MCV: 93.5 fL (ref 80.0–100.0)
Monocytes Absolute: 0.6 10*3/uL (ref 0.1–1.0)
Monocytes Relative: 9 %
Neutro Abs: 3.6 10*3/uL (ref 1.7–7.7)
Neutrophils Relative %: 54 %
Platelets: 277 10*3/uL (ref 150–400)
RBC: 4.59 MIL/uL (ref 4.22–5.81)
RDW: 12.2 % (ref 11.5–15.5)
WBC: 6.7 10*3/uL (ref 4.0–10.5)
nRBC: 0 % (ref 0.0–0.2)

## 2020-05-23 LAB — PROTIME-INR
INR: 1.3 — ABNORMAL HIGH (ref 0.8–1.2)
Prothrombin Time: 15.7 seconds — ABNORMAL HIGH (ref 11.4–15.2)

## 2020-05-23 LAB — BASIC METABOLIC PANEL
Anion gap: 12 (ref 5–15)
BUN: 26 mg/dL — ABNORMAL HIGH (ref 8–23)
CO2: 26 mmol/L (ref 22–32)
Calcium: 9.2 mg/dL (ref 8.9–10.3)
Chloride: 102 mmol/L (ref 98–111)
Creatinine, Ser: 1.09 mg/dL (ref 0.61–1.24)
GFR, Estimated: >60 mL/min (ref >60)
Glucose, Bld: 113 mg/dL — ABNORMAL HIGH (ref 70–99)
Potassium: 3.3 mmol/L — ABNORMAL LOW (ref 3.5–5.1)
Sodium: 140 mmol/L (ref 135–145)

## 2020-05-23 LAB — TROPONIN I (HIGH SENSITIVITY): Troponin I (High Sensitivity): 3 ng/L (ref <18)

## 2020-05-23 MED ORDER — SODIUM CHLORIDE 0.9 % IV BOLUS
1000.0000 mL | Freq: Once | INTRAVENOUS | Status: AC
  Administered 2020-05-23: 19:00:00 1000 mL via INTRAVENOUS

## 2020-05-23 MED ORDER — LIDOCAINE-EPINEPHRINE 1 %-1:100000 IJ SOLN
20.0000 mL | Freq: Once | INTRAMUSCULAR | Status: AC
  Administered 2020-05-23: 19:00:00 20 mL
  Filled 2020-05-23: qty 1

## 2020-05-23 NOTE — Discharge Instructions (Signed)
Suture removal in a week   Take tylenol for pain   See your doctor   Return to ER if you have worse headaches, vomiting, passing out

## 2020-05-23 NOTE — ED Notes (Signed)
Patient transported to CT 

## 2020-05-23 NOTE — Progress Notes (Signed)
Orthopedic Tech Progress Note Patient Details:  Craig Booth 1950/09/15 [id number] Level 2 Trauma. Patient ID: [name], male   DOB: [birth year], 69 y.o.   MRN: [id number]   [name] 05/23/2020, 6:08 PM

## 2020-05-23 NOTE — ED Notes (Signed)
Pt transported to CT ?

## 2020-05-23 NOTE — ED Triage Notes (Signed)
Pt BIB [facility] d/t fall on thinners. Pt stood off couch & wife stated that he seemed to have a syncaple episode & fell, hit his head on table. Pt denies LOC, A/Ox4, verbal-able to make needs known. 148/90 60 bpm 96% RA CBG 115

## 2020-05-23 NOTE — ED Provider Notes (Signed)
[facility] EMERGENCY DEPARTMENT Provider Note   CSN: [id number] Arrival date & time: 05/23/20  1802     History No chief complaint on file.   Craig Booth is a 69 y.o. male here presenting with fall.  Patient states that he took his blood pressure medicine and was laying on the couch and got up suddenly and felt lightheaded and dizzy and fell backwards.  Wife was not sure if he passed out or not but he states that he did not.  He is on Eliquis a level 2 trauma was activated by EMS.  Patient was noted to have a laceration in the right scalp area.   The history is provided by the patient.       History reviewed. No pertinent past medical history.  There are no problems to display for this patient.     History reviewed. No pertinent family history.     Home Medications Prior to Admission medications   Not on File    Allergies    Patient has no allergy information on record.  Review of Systems   Review of Systems  Skin: Positive for wound.  All other systems reviewed and are negative.   Physical Exam Updated Vital Signs BP 133/74   Pulse 65   Temp 97.6 F (36.4 C) (Oral)   Resp 15   Ht 6\' 3"  (1.905 m)   Wt 98.9 kg   SpO2 95%   BMI 27.25 kg/m   Physical Exam Vitals and nursing note reviewed.  Constitutional:      Appearance: Normal appearance.  HENT:     Head:     Comments: 5 cm laceration R temporal area     Mouth/Throat:     Mouth: Mucous membranes are moist.  Eyes:     Extraocular Movements: Extraocular movements intact.     Pupils: Pupils are equal, round, and reactive to light.  Cardiovascular:     Rate and Rhythm: Normal rate and regular rhythm.     Pulses: Normal pulses.     Heart sounds: Normal heart sounds.  Pulmonary:     Effort: Pulmonary effort is normal.     Breath sounds: Normal breath sounds.  Abdominal:     General: Abdomen is flat.     Palpations: Abdomen is soft.  Musculoskeletal:        General:  Normal range of motion.     Cervical back: Normal range of motion and neck supple.     Comments: No obvious deformity   Skin:    General: Skin is warm.     Capillary Refill: Capillary refill takes less than 2 seconds.  Neurological:     General: No focal deficit present.     Mental Status: He is alert and oriented to person, place, and time.     Cranial Nerves: No cranial nerve deficit.     Sensory: No sensory deficit.     Motor: No weakness.  Psychiatric:        Mood and Affect: Mood normal.        Behavior: Behavior normal.     ED Results / Procedures / Treatments   Labs (all labs ordered are listed, but only abnormal results are displayed) Labs Reviewed  BASIC METABOLIC PANEL - Abnormal; Notable for the following components:      Result Value   Potassium 3.3 (*)    Glucose, Bld 113 (*)    BUN 26 (*)    All other components within  normal limits  PROTIME-INR - Abnormal; Notable for the following components:   Prothrombin Time 15.7 (*)    INR 1.3 (*)    All other components within normal limits  CBC WITH DIFFERENTIAL/PLATELET  TROPONIN I (HIGH SENSITIVITY)    EKG EKG Interpretation  Date/Time:  Saturday May 23 2020 18:36:07 EST Ventricular Rate:  60 PR Interval:    QRS Duration: 135 QT Interval:  449 QTC Calculation: 449 R Axis:   80 Text Interpretation: Sinus rhythm IVCD, consider atypical RBBB No previous ECGs available Confirmed by Wandra Arthurs [phone]) on 05/23/2020 6:41:06 PM   Radiology CT Head Wo Contrast  Result Date: 05/23/2020 CLINICAL DATA:  Syncope, hit head EXAM: CT HEAD WITHOUT CONTRAST TECHNIQUE: Contiguous axial images were obtained from the base of the skull through the vertex without intravenous contrast. COMPARISON:  02/11/2020 FINDINGS: Brain: No acute intracranial abnormality. Specifically, no hemorrhage, hydrocephalus, mass lesion, acute infarction, or significant intracranial injury. Vascular: No hyperdense vessel or unexpected  calcification. Skull: No acute calvarial abnormality. Sinuses/Orbits: No acute findings Other: None IMPRESSION: No acute intracranial abnormality. Electronically Signed   By: Rolm Baptise M.D.   On: 05/23/2020 19:01    Procedures Procedures (including critical care time)  LACERATION REPAIR Performed by: Wandra Arthurs Authorized by: Wandra Arthurs Consent: Verbal consent obtained. Risks and benefits: risks, benefits and alternatives were discussed Consent given by: patient Patient identity confirmed: provided demographic data Prepped and Draped in normal sterile fashion Wound explored  Laceration Location: R scalp   Laceration Length: 5 cm  No Foreign Bodies seen or palpated  Anesthesia: local infiltration  Local anesthetic: lidocaine 2 % with epinephrine  Anesthetic total: 10 ml  Irrigation method: syringe Amount of cleaning: standard  Skin closure: 5-0 ethilon  Number of sutures: 6  Technique: simple interrupted   Patient tolerance: Patient tolerated the procedure well with no immediate complications.    Medications Ordered in ED Medications  sodium chloride 0.9 % bolus 1,000 mL (0 mLs Intravenous Stopped 05/23/20 1918)  lidocaine-EPINEPHrine (XYLOCAINE W/EPI) 1 %-1:100000 (with pres) injection 20 mL (20 mLs Infiltration Given by Other 05/23/20 1919)    ED Course  I have reviewed the triage vital signs and the nursing notes.  Pertinent labs & imaging results that were available during my care of the patient were reviewed by me and considered in my medical decision making (see chart for details).    MDM Rules/Calculators/A&P                         Craig Booth is a 69 y.o. male here with fall. Mechanical fall when standing up. Likely orthostasis. Will get labs, CT head, orthostatics.   8:00 PM Labs and CT head unremarkable.  Suture removal in a week.   Final Clinical Impression(s) / ED Diagnoses Final diagnoses:  None    Rx / DC Orders ED Discharge  Orders    None       Drenda Freeze, MD 05/23/20 2004

## 2020-05-25 ENCOUNTER — Encounter: Payer: Self-pay | Admitting: Neurology

## 2020-05-27 NOTE — Progress Notes (Signed)
PATIENT: Craig Booth DOB: 1951-03-04  REASON FOR VISIT: follow up HISTORY FROM: patient  HISTORY OF PRESENT ILLNESS: Today 05/28/20  HISTORY Craig Booth is a 69 year old male, seen in request by his primary care physician Dr. Deland Pretty for evaluation of sudden onset memory loss on February 11, 2020   I reviewed and summarized the referring note. Hypertension Factor V Leyden mutation, on chronic Xarelto treatment History of kidney stone Prostate cancer  I reviewed the record brought in by patient, he had sudden onset of memory loss on February 11, 2020 around 1 PM, while watching their daily show jeopardy, during conversation, his wife realized that patient could not remember the previous Donna Christen has diet, his wife also noticed that patient has slower reaction time, taking much longer to put on his cloth to get himself ready for the hospital visit, he could not remember that they have moved his daughter recently, he tended to repeat questions, repetitively taking out his cell phone and put it back again, looking confused  His memory gradually came back around 7 PM while he was having MRI of brain, by 8pm when he was discharged from the hospital the same day, he had significant recovery, he described his typical migraine headache that night, lasting 3 to 4 hours, by the time he went to bed at midnight, his memory most came back.   Patient described intermittent subtle changes over the past few days, but wife denies any difference.  Personally reviewed MRI of the brain without contrast that was essentially normal, CT angiogram of head and neck showed mild bilateral carotid and intracranial atherosclerotic disease, no significant large vessel disease  Laboratory evaluation showed negative UDS, CBC, CMP,  He has strong family history of memory loss, father, paternal uncle suffered dementia,  He is a retired English as a second language teacher, very mentally, and physically  active in his daily activity, there was no significant memory loss,   He had a history of recurrent right lower extremity DVT, found to have factor V Leyden mutation, has been treated with long-term anticoagulation for over 20 years, previously on Coumadin, now on Xarelto  Reported few years history of intermittent diplopia, was seen by ophthalmologist, told to have underlying misalignment, reported acetylcholine receptor antibody was negative   Update May 28, 2020 SS: EEG was normal.  Normal MRI of the brain.  Echocardiogram has been ordered but is yet to be completed.  RPR, B12, TSH was normal.  Suffered a fall a few days ago, stood up suddenly, felt dizzy and lightheaded, fell backwards.  Had a right scalp laceration went to the ER. Has sutures, was trauma due to taking Xarelto.  2 weeks prior to fall, under more stress, his mother with vascular dementia passed away, had family in town.  BP was elevated 160/120, was placed on chlorthalidone, BP dropped, prompting the fall.  He fell hard, hit his head on the edge of a table, thinks unconscious for a few seconds.  Feels mild concussion, feels slightly fuzzy headed.    MMSE 30/30 today.  No further episodes of acute onset memory loss that happened February 11, 2020.  We talked about event in August, he has no recollection of it.   REVIEW OF SYSTEMS: Out of a complete 14 system review of symptoms, the patient complains only of the following symptoms, and all other reviewed systems are negative.  N/A  ALLERGIES: Allergies  Allergen Reactions   Betadine [Povidone Iodine] Other (See Comments)  Eye burning after eye surgery   Amoxicillin Hives and Other (See Comments)    Itching   Bee Venom     HOME MEDICATIONS: Outpatient Medications Prior to Visit  Medication Sig Dispense Refill   atorvastatin (LIPITOR) 10 MG tablet Take 10 mg by mouth daily.     chlorthalidone (HYGROTON) 25 MG tablet Take 25 mg by mouth daily.     Magnesium  400 MG CAPS Take 1 capsule by mouth every other day.     Multiple Vitamin (MULTIVITAMIN) capsule Take 1 capsule by mouth daily.     tadalafil (CIALIS) 5 MG tablet Take 5 mg by mouth as needed for erectile dysfunction.     telmisartan (MICARDIS) 80 MG tablet Take 80 mg by mouth daily.     vitamin C (ASCORBIC ACID) 500 MG tablet Take 500 mg by mouth daily.     XARELTO 20 MG TABS tablet Take 20 mg by mouth daily.     telmisartan (MICARDIS) 40 MG tablet Take 40 mg by mouth daily.     No facility-administered medications prior to visit.    PAST MEDICAL HISTORY: Past Medical History:  Diagnosis Date   Anticoagulated on Coumadin    Auditory vertigo involving left ear VIRAL EAR INFECTION IMPROVING   Compressed vertebrae    YRS AGO--  LUMBAR   Factor V Leiden mutation ([place])    MONTIORED BY DR Glennon Hamilton W/ [facility]   History of basal cell carcinoma excision    History of DVT of lower extremity RIGHT /NOV 2011   and right groin   History of kidney stones    History of phlebitis NOV 2011   Hypercholesterolemia    improved with diet, not taking medication   Hypertension    Incomplete RBBB    Migraine    Organic impotence    Pneumonia    Prostate cancer ([place]) 06/25/2012   Adenocarcinoma,gleason=3+3=6,PSA=3.8,volume=44cc   Transient global amnesia     PAST SURGICAL HISTORY: Past Surgical History:  Procedure Laterality Date   COLONOSCOPY  2012   CYSTOSCOPY N/A 11/08/2012   Procedure: CYSTOSCOPY FLEXIBLE;  Surgeon: Malka So, MD;  Location[facility];  Service: Urology;  Laterality: N/A;  NO SEEDS FOUND IN BLADDER   CYSTOSCOPY WITH RETROGRADE PYELOGRAM, URETEROSCOPY AND STENT PLACEMENT Right 10/01/2014   Procedure: CYSTOSCOPY WITH RETROGRADE PYELOGRAM, URETEROSCOPY, STONE REMOVAL, AND STENT PLACEMENT;  Surgeon: Raynelle Bring, MD;  Location: [facility];  Service: Urology;  Laterality: Right;   HOLMIUM LASER APPLICATION Right 0/48/8891   Procedure:  HOLMIUM LASER APPLICATION;  Surgeon: Raynelle Bring, MD;  Location: [facility];  Service: Urology;  Laterality: Right;   KIDNEY STONE SURGERY     ORCHIECTOMY Right 05/22/2018   Procedure: ORCHIECTOMY, RADICAL;  Surgeon: Irine Seal, MD;  Location: [facility];  Service: Urology;  Laterality: Right;   PROSTATE BIOPSY  06/25/2012   Adenocarcinoma   RADIOACTIVE SEED IMPLANT N/A 11/08/2012   Procedure:  71 RADIOACTIVE SEED IMPLANT;  Surgeon: Malka So, MD;  Location[facility];  Service: Urology;  Laterality: N/A;      SEEDS IMPLANTED   TONSILLECTOMY  AGE 63   UMBILICAL HERNIA REPAIR  02/10/2011    FAMILY HISTORY: Family History  Problem Relation Age of Onset   Cancer Father        brian tumor, gliobalstoma   Dementia Father    Dementia Mother     SOCIAL HISTORY: Social History   Socioeconomic History   Marital status: Married  Spouse name: Not on file   Number of children: 3   Years of education: college   Highest education level: Not on file  Occupational History   Occupation: Retired    Fish farm manager: [employer]    Comment: [employer]  Tobacco Use   Smoking status: Never Smoker   Smokeless tobacco: Never Used  Scientific laboratory technician Use: Never used  Substance and Sexual Activity   Alcohol use: Not Currently   Drug use: No   Sexual activity: Not Currently  Other Topics Concern   Not on file  Social History Narrative   ** Merged History Encounter **       Lives at home with his wife. Ambidextrous. No daily caffeine use.    Social Determinants of Health   Financial Resource Strain: Not on file  Food Insecurity: Not on file  Transportation Needs: Not on file  Physical Activity: Not on file  Stress: Not on file  Social Connections: Not on file  Intimate Partner Violence: Not on file      PHYSICAL EXAM  Vitals:   05/28/20 0827  BP: 130/72  Pulse: 73  Weight: 223 lb (101.2 kg)  Height: 6\' 3"  (1.905 m)   Body mass index is 27.87  kg/m.  Generalized: Well developed, in no acute distress  MMSE - Mini Mental State Exam 05/28/2020 02/18/2020  Orientation to time 5 5  Orientation to Place 5 5  Registration 3 3  Attention/ Calculation 5 5  Recall 3 3  Language- name 2 objects 2 2  Language- repeat 1 1  Language- follow 3 step command 3 3  Language- read & follow direction 1 1  Write a sentence 1 1  Copy design 1 1  Total score 30 30    Neurological examination  Mentation: Alert oriented to time, place, history taking. Follows all commands speech and language fluent Cranial nerve II-XII: Pupils were equal round reactive to light. Extraocular movements were full, visual field were full on confrontational test. Facial sensation and strength were normal. Head turning and shoulder shrug  were normal and symmetric. Motor: The motor testing reveals 5 over 5 strength of all 4 extremities. Good symmetric motor tone is noted throughout.  Sensory: Sensory testing is intact to soft touch on all 4 extremities. No evidence of extinction is noted.  Coordination: Cerebellar testing reveals good finger-nose-finger and heel-to-shin bilaterally.  Gait and station: Gait is normal. Tandem gait is normal. Romberg is negative. No drift is seen.  Reflexes: Deep tendon reflexes are symmetric and normal bilaterally.   DIAGNOSTIC DATA (LABS, IMAGING, TESTING) - I reviewed patient records, labs, notes, testing and imaging myself where available.  Lab Results  Component Value Date   WBC 6.7 05/23/2020   HGB 14.5 05/23/2020   HCT 42.9 05/23/2020   MCV 93.5 05/23/2020   PLT 277 05/23/2020      Component Value Date/Time   NA 140 05/23/2020 1816   K 3.3 (L) 05/23/2020 1816   CL 102 05/23/2020 1816   CO2 26 05/23/2020 1816   GLUCOSE 113 (H) 05/23/2020 1816   BUN 26 (H) 05/23/2020 1816   CREATININE 1.09 05/23/2020 1816   CALCIUM 9.2 05/23/2020 1816   PROT 6.8 02/11/2020 1547   ALBUMIN 4.3 02/11/2020 1547   AST 27 02/11/2020 1547    ALT 30 02/11/2020 1547   ALKPHOS 65 02/11/2020 1547   BILITOT 1.0 02/11/2020 1547   GFRNONAA >60 05/23/2020 1816   GFRAA >60 02/11/2020 1547   No  results found for: CHOL, HDL, LDLCALC, LDLDIRECT, TRIG, CHOLHDL No results found for: HGBA1C Lab Results  Component Value Date   VITAMINB12 919 02/18/2020   Lab Results  Component Value Date   TSH 2.980 02/18/2020   ASSESSMENT AND PLAN 69 y.o. year old male  has a past medical history of Anticoagulated on Coumadin, Auditory vertigo involving left ear (VIRAL EAR INFECTION IMPROVING), Compressed vertebrae, Factor V Leiden mutation ([place]), History of basal cell carcinoma excision, History of DVT of lower extremity (RIGHT /NOV 2011), History of kidney stones, History of phlebitis (NOV 2011), Hypercholesterolemia, Hypertension, Incomplete RBBB, Migraine, Organic impotence, Pneumonia, Prostate cancer ([place]) (06/25/2012), and Transient global amnesia. here with:  1.  Transient global amnesia February 11, 2020 2.  Strong family history of dementia 3.  History of factor V Leiden mutation, on chronic anticoagulation Xarelto -MRI of the brain has been normal -Differential diagnosis includes TIA versus partial seizure -EEG was normal -Echocardiogram has yet to be completed, will get this set up -Emphasized the importance of increased water intake, moderate exercise, brain stimulating exercises -Seeing PCP next week, labs today, mention BP drop prompting fall, on chlorthalidone and telmisartan -CT head 05/23/2020 post fall showed no acute intercranial abnormality -RPR, B12, TSH were unremarkable -Follow-up in 6 months or sooner if needed   I spent 30 minutes of face-to-face and non-face-to-face time with patient.  This included previsit chart review, lab review, study review, order entry, electronic health record documentation, patient education.  Butler Denmark, AGNP-C, DNP 05/28/2020, 9:19 AM [facility] [address] [place], [place] [address] [phone]

## 2020-05-28 ENCOUNTER — Ambulatory Visit (INDEPENDENT_AMBULATORY_CARE_PROVIDER_SITE_OTHER): Payer: Medicare Other | Admitting: Neurology

## 2020-05-28 ENCOUNTER — Other Ambulatory Visit: Payer: Self-pay

## 2020-05-28 ENCOUNTER — Telehealth: Payer: Self-pay | Admitting: Neurology

## 2020-05-28 ENCOUNTER — Encounter: Payer: Self-pay | Admitting: Neurology

## 2020-05-28 VITALS — BP 130/72 | HR 73 | Ht 75.0 in | Wt 223.0 lb

## 2020-05-28 DIAGNOSIS — G454 Transient global amnesia: Secondary | ICD-10-CM

## 2020-05-28 DIAGNOSIS — I10 Essential (primary) hypertension: Secondary | ICD-10-CM | POA: Diagnosis not present

## 2020-05-28 NOTE — Telephone Encounter (Signed)
Can you check on Craig Booth's echocardiogram? Ordered back in September, needs to be scheduled. Thanks!

## 2020-05-28 NOTE — Patient Instructions (Signed)
Continue current treatment plan  Continue follow-up with primary care doctor  See you back in 4-5 months Will get set up for echocardiogram

## 2020-05-29 DIAGNOSIS — R55 Syncope and collapse: Secondary | ICD-10-CM | POA: Diagnosis not present

## 2020-05-29 DIAGNOSIS — S0101XA Laceration without foreign body of scalp, initial encounter: Secondary | ICD-10-CM | POA: Diagnosis not present

## 2020-05-29 DIAGNOSIS — I10 Essential (primary) hypertension: Secondary | ICD-10-CM | POA: Diagnosis not present

## 2020-06-01 NOTE — Telephone Encounter (Signed)
Patient is scheduled For his Echo .  Order did not drop I have talked to patient and he is going This Thursday . Thanks Hinton Dyer

## 2020-06-04 ENCOUNTER — Other Ambulatory Visit: Payer: Self-pay

## 2020-06-04 ENCOUNTER — Ambulatory Visit (HOSPITAL_COMMUNITY)
Admission: RE | Admit: 2020-06-04 | Discharge: 2020-06-04 | Disposition: A | Discharge status: Home / Self Care | Payer: Medicare Other | Source: Ambulatory Visit | Attending: Neurology | Admitting: Neurology

## 2020-06-04 DIAGNOSIS — I4510 Unspecified right bundle-branch block: Secondary | ICD-10-CM | POA: Diagnosis not present

## 2020-06-04 DIAGNOSIS — E785 Hyperlipidemia, unspecified: Secondary | ICD-10-CM | POA: Diagnosis not present

## 2020-06-04 DIAGNOSIS — I10 Essential (primary) hypertension: Secondary | ICD-10-CM | POA: Insufficient documentation

## 2020-06-04 DIAGNOSIS — G454 Transient global amnesia: Secondary | ICD-10-CM | POA: Diagnosis not present

## 2020-06-04 DIAGNOSIS — G458 Other transient cerebral ischemic attacks and related syndromes: Secondary | ICD-10-CM

## 2020-06-04 DIAGNOSIS — G459 Transient cerebral ischemic attack, unspecified: Secondary | ICD-10-CM | POA: Insufficient documentation

## 2020-06-04 LAB — ECHOCARDIOGRAM COMPLETE
Area-P 1/2: 1.98 cm2
S' Lateral: 3.3 cm

## 2020-06-04 NOTE — Progress Notes (Signed)
  Echocardiogram 2D Echocardiogram with 3D has been performed.  Darlina Sicilian M 06/04/2020, 3:35 PM

## 2020-06-10 ENCOUNTER — Telehealth: Payer: Self-pay | Admitting: *Deleted

## 2020-06-10 NOTE — Telephone Encounter (Signed)
I spoke to the patient and he verbalized understanding of the results. 

## 2020-06-10 NOTE — Telephone Encounter (Signed)
-----   Message from Micki Riley, MD sent at 06/10/2020  4:37 PM EST ----- Kindly inform patient that echo cardiogram study of heart was normal

## 2020-06-10 NOTE — Progress Notes (Signed)
Kindly inform patient that echo cardiogram study of heart was normal

## 2020-06-19 ENCOUNTER — Other Ambulatory Visit: Payer: Self-pay

## 2020-06-19 ENCOUNTER — Ambulatory Visit (HOSPITAL_COMMUNITY)
Admission: RE | Admit: 2020-06-19 | Discharge: 2020-06-19 | Disposition: A | Discharge status: Home / Self Care | Payer: Medicare Other | Source: Ambulatory Visit | Attending: Urology | Admitting: Urology

## 2020-06-19 ENCOUNTER — Other Ambulatory Visit (HOSPITAL_COMMUNITY): Payer: Self-pay | Admitting: Urology

## 2020-06-19 DIAGNOSIS — Z8547 Personal history of malignant neoplasm of testis: Secondary | ICD-10-CM | POA: Diagnosis not present

## 2020-06-25 DIAGNOSIS — N281 Cyst of kidney, acquired: Secondary | ICD-10-CM | POA: Diagnosis not present

## 2020-06-25 DIAGNOSIS — Z9079 Acquired absence of other genital organ(s): Secondary | ICD-10-CM | POA: Diagnosis not present

## 2020-06-25 DIAGNOSIS — C6290 Malignant neoplasm of unspecified testis, unspecified whether descended or undescended: Secondary | ICD-10-CM | POA: Diagnosis not present

## 2020-06-25 DIAGNOSIS — I700 Atherosclerosis of aorta: Secondary | ICD-10-CM | POA: Diagnosis not present

## 2020-06-25 DIAGNOSIS — Z8547 Personal history of malignant neoplasm of testis: Secondary | ICD-10-CM | POA: Diagnosis not present

## 2020-07-06 DIAGNOSIS — I10 Essential (primary) hypertension: Secondary | ICD-10-CM | POA: Diagnosis not present

## 2020-07-06 DIAGNOSIS — R55 Syncope and collapse: Secondary | ICD-10-CM | POA: Diagnosis not present

## 2020-07-06 DIAGNOSIS — Z8547 Personal history of malignant neoplasm of testis: Secondary | ICD-10-CM | POA: Diagnosis not present

## 2020-07-06 DIAGNOSIS — Z8546 Personal history of malignant neoplasm of prostate: Secondary | ICD-10-CM | POA: Diagnosis not present

## 2020-07-06 DIAGNOSIS — Z87442 Personal history of urinary calculi: Secondary | ICD-10-CM | POA: Diagnosis not present

## 2020-07-06 DIAGNOSIS — N281 Cyst of kidney, acquired: Secondary | ICD-10-CM | POA: Diagnosis not present

## 2020-07-16 NOTE — Progress Notes (Signed)
I have reviewed and agreed above plan. 

## 2020-07-31 DIAGNOSIS — H26491 Other secondary cataract, right eye: Secondary | ICD-10-CM | POA: Diagnosis not present

## 2020-08-24 DIAGNOSIS — Z125 Encounter for screening for malignant neoplasm of prostate: Secondary | ICD-10-CM | POA: Diagnosis not present

## 2020-08-24 DIAGNOSIS — I10 Essential (primary) hypertension: Secondary | ICD-10-CM | POA: Diagnosis not present

## 2020-08-24 DIAGNOSIS — E7800 Pure hypercholesterolemia, unspecified: Secondary | ICD-10-CM | POA: Diagnosis not present

## 2020-08-25 DIAGNOSIS — Z20822 Contact with and (suspected) exposure to covid-19: Secondary | ICD-10-CM | POA: Diagnosis not present

## 2020-08-27 ENCOUNTER — Telehealth: Payer: Self-pay | Admitting: Neurology

## 2020-08-27 NOTE — Telephone Encounter (Signed)
Pt called needing to speak to the RN regarding the labs that he had last. Pt states that he is needed them coded differently in order for his insurance to cover and he not get stuck with the bill. Please advise.

## 2020-08-27 NOTE — Telephone Encounter (Signed)
I called [facility] at [phone] and spoke to rep, [place]. He informed me the TSH was not covered and would need new ICD10 codes to be re-filed. He was provided with Z13.29 (thyroid disorder screening) and E07.9 (r/o thyroid disease). They already had code R79.89 (r/o abnormal TSH). They will send the claim back through the patient's medicare plan. Pilar Plate stated he could disregard the bill he has now. Reprocessing the claim will take 30-45 days. He will receive a new bill if anything is due after the updated claim has been reviewed.   I called the patient back and provided him with this update.

## 2020-08-27 NOTE — Telephone Encounter (Signed)
Called patient who stated he is referring to labs drawn in Sept 2021. I advised him we often get fax from Medicare to send in new codes for labs done, sometimes months ago. I advised will send this note to Sharyn Lull, Dr Rhea Belton nurse. She may or may not remember if she has already gotten fax to recode those labs. He will call [facility] to find out what to do about his bill in the meantime. Patient verbalized understanding, appreciation.

## 2020-08-31 DIAGNOSIS — I700 Atherosclerosis of aorta: Secondary | ICD-10-CM | POA: Diagnosis not present

## 2020-08-31 DIAGNOSIS — I4510 Unspecified right bundle-branch block: Secondary | ICD-10-CM | POA: Diagnosis not present

## 2020-08-31 DIAGNOSIS — Z86718 Personal history of other venous thrombosis and embolism: Secondary | ICD-10-CM | POA: Diagnosis not present

## 2020-08-31 DIAGNOSIS — I10 Essential (primary) hypertension: Secondary | ICD-10-CM | POA: Diagnosis not present

## 2020-08-31 DIAGNOSIS — Z0001 Encounter for general adult medical examination with abnormal findings: Secondary | ICD-10-CM | POA: Diagnosis not present

## 2020-08-31 DIAGNOSIS — Z8547 Personal history of malignant neoplasm of testis: Secondary | ICD-10-CM | POA: Diagnosis not present

## 2020-08-31 DIAGNOSIS — E7800 Pure hypercholesterolemia, unspecified: Secondary | ICD-10-CM | POA: Diagnosis not present

## 2020-08-31 DIAGNOSIS — J302 Other seasonal allergic rhinitis: Secondary | ICD-10-CM | POA: Diagnosis not present

## 2020-08-31 DIAGNOSIS — N529 Male erectile dysfunction, unspecified: Secondary | ICD-10-CM | POA: Diagnosis not present

## 2020-09-03 DIAGNOSIS — H2511 Age-related nuclear cataract, right eye: Secondary | ICD-10-CM | POA: Diagnosis not present

## 2020-09-03 DIAGNOSIS — H26491 Other secondary cataract, right eye: Secondary | ICD-10-CM | POA: Diagnosis not present

## 2020-09-29 DIAGNOSIS — E7800 Pure hypercholesterolemia, unspecified: Secondary | ICD-10-CM | POA: Diagnosis not present

## 2020-09-29 DIAGNOSIS — I10 Essential (primary) hypertension: Secondary | ICD-10-CM | POA: Diagnosis not present

## 2020-10-05 DIAGNOSIS — D225 Melanocytic nevi of trunk: Secondary | ICD-10-CM | POA: Diagnosis not present

## 2020-10-05 DIAGNOSIS — D692 Other nonthrombocytopenic purpura: Secondary | ICD-10-CM | POA: Diagnosis not present

## 2020-10-05 DIAGNOSIS — L814 Other melanin hyperpigmentation: Secondary | ICD-10-CM | POA: Diagnosis not present

## 2020-10-05 DIAGNOSIS — L821 Other seborrheic keratosis: Secondary | ICD-10-CM | POA: Diagnosis not present

## 2020-10-05 DIAGNOSIS — D1801 Hemangioma of skin and subcutaneous tissue: Secondary | ICD-10-CM | POA: Diagnosis not present

## 2020-10-05 DIAGNOSIS — C44612 Basal cell carcinoma of skin of right upper limb, including shoulder: Secondary | ICD-10-CM | POA: Diagnosis not present

## 2020-10-05 DIAGNOSIS — L905 Scar conditions and fibrosis of skin: Secondary | ICD-10-CM | POA: Diagnosis not present

## 2020-10-05 DIAGNOSIS — D485 Neoplasm of uncertain behavior of skin: Secondary | ICD-10-CM | POA: Diagnosis not present

## 2020-10-05 DIAGNOSIS — C44619 Basal cell carcinoma of skin of left upper limb, including shoulder: Secondary | ICD-10-CM | POA: Diagnosis not present

## 2020-10-05 DIAGNOSIS — Z85828 Personal history of other malignant neoplasm of skin: Secondary | ICD-10-CM | POA: Diagnosis not present

## 2020-10-19 DIAGNOSIS — S30860A Insect bite (nonvenomous) of lower back and pelvis, initial encounter: Secondary | ICD-10-CM | POA: Diagnosis not present

## 2020-10-19 DIAGNOSIS — W57XXXA Bitten or stung by nonvenomous insect and other nonvenomous arthropods, initial encounter: Secondary | ICD-10-CM | POA: Diagnosis not present

## 2020-12-17 ENCOUNTER — Ambulatory Visit: Payer: Medicare Other | Admitting: Neurology

## 2020-12-24 ENCOUNTER — Ambulatory Visit (INDEPENDENT_AMBULATORY_CARE_PROVIDER_SITE_OTHER): Payer: Medicare Other | Admitting: Neurology

## 2020-12-24 ENCOUNTER — Encounter: Payer: Self-pay | Admitting: Neurology

## 2020-12-24 ENCOUNTER — Other Ambulatory Visit: Payer: Self-pay

## 2020-12-24 VITALS — BP 153/90 | HR 73 | Ht 75.0 in | Wt 229.0 lb

## 2020-12-24 DIAGNOSIS — Z8547 Personal history of malignant neoplasm of testis: Secondary | ICD-10-CM | POA: Diagnosis not present

## 2020-12-24 DIAGNOSIS — G454 Transient global amnesia: Secondary | ICD-10-CM

## 2020-12-24 NOTE — Progress Notes (Signed)
Assessment and plan:  History of global amnesia in August 2021, Essentially normal neurological examination, Normal MRI of the brain August 2021 Normal EEG Echocardiogram Laboratory evaluation showed no treatable etiology MoCA examination 30/30 today Strong family history of dementia, brain disease, I have advised patient continue moderate exercise, keep enough sleep, water intake No need for neurological follow-up   History:  Craig Booth is a 70 year old male, seen in request by his primary care physician Dr. Deland Pretty for evaluation of sudden onset memory loss on February 11, 2020     I reviewed and summarized the referring note. Hypertension Factor V Leyden mutation, on chronic Xarelto treatment History of kidney stone Prostate cancer   I reviewed the record brought in by patient, he had sudden onset of memory loss on February 11, 2020 around 1 PM, while watching their daily show jeopardy, during conversation, his wife realized that patient could not remember the previous Donna Christen has diet, his wife also noticed that patient has slower reaction time, taking much longer to put on his cloth to get himself ready for the hospital visit, he could not remember that they have moved his daughter recently, he tended to repeat questions, repetitively taking out his cell phone and put it back again, looking confused   His memory gradually came back around 7 PM while he was having MRI of brain, by 8pm when he was discharged from the hospital the same day, he had significant recovery, he described his typical migraine headache that night, lasting 3 to 4 hours, by the time he went to bed at midnight, his memory most came back.   Patient described intermittent subtle changes over the past few days, but wife denies any difference.   Personally reviewed MRI of the brain without contrast that was essentially normal, CT angiogram of head and neck showed mild bilateral carotid and  intracranial atherosclerotic disease, no significant large vessel disease   Laboratory evaluation showed negative UDS, CBC, CMP,   He has strong family history of memory loss, father, paternal uncle suffered dementia,   He is a retired English as a second language teacher, very mentally, and physically active in his daily activity, there was no significant memory loss,   He had a history of recurrent right lower extremity DVT, found to have factor V Leyden mutation, has been treated with long-term anticoagulation for over 20 years, previously on Coumadin, now on Xarelto   Reported few years history of intermittent diplopia, was seen by ophthalmologist, told to have underlying misalignment, reported acetylcholine receptor antibody was negative   UPDATE December 24 2020: He is concerned because of family history of dementia, he is doing very well, lives at home with his wife  EEG was normal.  We personally reviewed MRI of the brain in  August 2021 that was normal Echocardiogram was normal in Dec 2021.  RPR, B12, TSH was normal.   PHYSICAL EXAMNIATION:  Gen: NAD, conversant, well nourised, well groomed                     Cardiovascular: Regular rate rhythm, no peripheral edema, warm, nontender. Eyes: Conjunctivae clear without exudates or hemorrhage Neck: Supple, no carotid bruits. Pulmonary: Clear to auscultation bilaterally   NEUROLOGICAL EXAM:  MENTAL STATUS: Speech/Cognition: Awake, alert, normal speech, oriented to history taking and casual conversation.  Montreal Cognitive Assessment  12/24/2020  Visuospatial/ Executive (0/5) 5  Naming (0/3) 3  Attention: Read list of digits (0/2) 2  Attention: Read list of  letters (0/1) 1  Attention: Serial 7 subtraction starting at 100 (0/3) 3  Language: Repeat phrase (0/2) 2  Language : Fluency (0/1) 1  Abstraction (0/2) 2  Delayed Recall (0/5) 5  Orientation (0/6) 6  Total 30  Adjusted Score (based on education) 30     CRANIAL NERVES: CN II:  Visual fields are full to confrontation.  Pupils are round equal and briskly reactive to light. CN III, IV, VI: extraocular movement are normal. No ptosis. CN V: Facial sensation is intact to light touch. CN VII: Face is symmetric with normal eye closure and smile. CN VIII: Hearing is normal to casual conversation CN IX, X: Palate elevates symmetrically. Phonation is normal. CN XI: Head turning and shoulder shrug are intact  MOTOR: Muscle bulk and tone are normal. Muscle strength is normal.  COORDINATION: There is no trunk or limb ataxia.    GAIT/STANCE: Posture is normal.   REVIEW OF SYSTEMS: Out of a complete 14 system review of symptoms, the patient complains only of the following symptoms, and all other reviewed systems are negative.  N/A  ALLERGIES: Allergies  Allergen Reactions   Betadine [Povidone Iodine] Other (See Comments)    Eye burning after eye surgery   Amoxicillin Hives and Other (See Comments)    Itching   Bee Venom    Chlorthalidone Other (See Comments)    Dizziness (severe - caused fall)    HOME MEDICATIONS: Outpatient Medications Prior to Visit  Medication Sig Dispense Refill   atorvastatin (LIPITOR) 10 MG tablet Take 10 mg by mouth daily.     hydrochlorothiazide (HYDRODIURIL) 25 MG tablet Take 12.5 mg by mouth daily.     Magnesium 400 MG CAPS Take 1 capsule by mouth every other day.     Multiple Vitamin (MULTIVITAMIN) capsule Take 1 capsule by mouth daily.     tadalafil (CIALIS) 20 MG tablet Take 20 mg by mouth as needed for erectile dysfunction.     telmisartan (MICARDIS) 80 MG tablet Take 80 mg by mouth daily.     vitamin C (ASCORBIC ACID) 500 MG tablet Take 500 mg by mouth daily.     XARELTO 20 MG TABS tablet Take 20 mg by mouth daily.     tadalafil (CIALIS) 5 MG tablet Take 5 mg by mouth as needed for erectile dysfunction.     chlorthalidone (HYGROTON) 25 MG tablet Take 25 mg by mouth daily.     No facility-administered medications prior to  visit.    PAST MEDICAL HISTORY: Past Medical History:  Diagnosis Date   Anticoagulated on Coumadin    Auditory vertigo involving left ear VIRAL EAR INFECTION IMPROVING   Compressed vertebrae    YRS AGO--  LUMBAR   Factor V Leiden mutation ([place])    MONTIORED BY DR Glennon Hamilton W/ [facility]   History of basal cell carcinoma excision    History of DVT of lower extremity RIGHT /NOV 2011   and right groin   History of kidney stones    History of phlebitis NOV 2011   Hypercholesterolemia    improved with diet, not taking medication   Hypertension    Incomplete RBBB    Migraine    Organic impotence    Pneumonia    Prostate cancer ([place]) 06/25/2012   Adenocarcinoma,gleason=3+3=6,PSA=3.8,volume=44cc   Transient global amnesia     PAST SURGICAL HISTORY: Past Surgical History:  Procedure Laterality Date   COLONOSCOPY  2012   CYSTOSCOPY N/A 11/08/2012   Procedure: CYSTOSCOPY FLEXIBLE;  Surgeon:  Malka So, MD;  Location: [facility];  Service: Urology;  Laterality: N/A;  NO SEEDS FOUND IN BLADDER   CYSTOSCOPY WITH RETROGRADE PYELOGRAM, URETEROSCOPY AND STENT PLACEMENT Right 10/01/2014   Procedure: CYSTOSCOPY WITH RETROGRADE PYELOGRAM, URETEROSCOPY, STONE REMOVAL, AND STENT PLACEMENT;  Surgeon: Raynelle Bring, MD;  Location: [facility];  Service: Urology;  Laterality: Right;   HOLMIUM LASER APPLICATION Right 1/61/0960   Procedure: HOLMIUM LASER APPLICATION;  Surgeon: Raynelle Bring, MD;  Location: [facility];  Service: Urology;  Laterality: Right;   KIDNEY STONE SURGERY     ORCHIECTOMY Right 05/22/2018   Procedure: ORCHIECTOMY, RADICAL;  Surgeon: Irine Seal, MD;  Location: [facility];  Service: Urology;  Laterality: Right;   PROSTATE BIOPSY  06/25/2012   Adenocarcinoma   RADIOACTIVE SEED IMPLANT N/A 11/08/2012   Procedure:  53 RADIOACTIVE SEED IMPLANT;  Surgeon: Malka So, MD;  Location: [facility];  Service: Urology;  Laterality: N/A;      SEEDS IMPLANTED   TONSILLECTOMY   AGE 29   UMBILICAL HERNIA REPAIR  02/10/2011    FAMILY HISTORY: Family History  Problem Relation Age of Onset   Cancer Father        brian tumor, gliobalstoma   Dementia Father    Dementia Mother     SOCIAL HISTORY: Social History   Socioeconomic History   Marital status: Married    Spouse name: Not on file   Number of children: 3   Years of education: college   Highest education level: Not on file  Occupational History   Occupation: Retired    Fish farm manager: Restaurant manager, fast food    Comment: [employer]  Tobacco Use   Smoking status: Never   Smokeless tobacco: Never  Vaping Use   Vaping Use: Never used  Substance and Sexual Activity   Alcohol use: Not Currently   Drug use: No   Sexual activity: Not Currently  Other Topics Concern   Not on file  Social History Narrative   ** Merged History Encounter **       Lives at home with his wife. Ambidextrous. No daily caffeine use.    Social Determinants of Health   Financial Resource Strain: Not on file  Food Insecurity: Not on file  Transportation Needs: Not on file  Physical Activity: Not on file  Stress: Not on file  Social Connections: Not on file  Intimate Partner Violence: Not on file     DIAGNOSTIC DATA (LABS, IMAGING, TESTING) - I reviewed patient records, labs, notes, testing and imaging myself where available.  Lab Results  Component Value Date   WBC 6.7 05/23/2020   HGB 14.5 05/23/2020   HCT 42.9 05/23/2020   MCV 93.5 05/23/2020   PLT 277 05/23/2020      Component Value Date/Time   NA 140 05/23/2020 1816   K 3.3 (L) 05/23/2020 1816   CL 102 05/23/2020 1816   CO2 26 05/23/2020 1816   GLUCOSE 113 (H) 05/23/2020 1816   BUN 26 (H) 05/23/2020 1816   CREATININE 1.09 05/23/2020 1816   CALCIUM 9.2 05/23/2020 1816   PROT 6.8 02/11/2020 1547   ALBUMIN 4.3 02/11/2020 1547   AST 27 02/11/2020 1547   ALT 30 02/11/2020 1547   ALKPHOS 65 02/11/2020 1547   BILITOT 1.0 02/11/2020 1547   GFRNONAA >60 05/23/2020  1816   GFRAA >60 02/11/2020 1547   No results found for: CHOL, HDL, LDLCALC, LDLDIRECT, TRIG, CHOLHDL No results found for: HGBA1C Lab Results  Component Value Date  RTMYTRZN35 919 02/18/2020   Lab Results  Component Value Date   TSH 2.980 02/18/2020  Marcial Pacas, M.D. Ph.D.  [facility] [place], [place] [address] Phone: [phone] Fax:      [phone]

## 2020-12-28 ENCOUNTER — Ambulatory Visit (HOSPITAL_COMMUNITY)
Admission: RE | Admit: 2020-12-28 | Discharge: 2020-12-28 | Disposition: A | Discharge status: Home / Self Care | Payer: Medicare Other | Source: Ambulatory Visit | Attending: Urology | Admitting: Urology

## 2020-12-28 ENCOUNTER — Other Ambulatory Visit (HOSPITAL_COMMUNITY): Payer: Self-pay | Admitting: Urology

## 2020-12-28 ENCOUNTER — Other Ambulatory Visit: Payer: Self-pay

## 2020-12-28 DIAGNOSIS — K769 Liver disease, unspecified: Secondary | ICD-10-CM | POA: Diagnosis not present

## 2020-12-28 DIAGNOSIS — Z8547 Personal history of malignant neoplasm of testis: Secondary | ICD-10-CM | POA: Diagnosis not present

## 2020-12-28 DIAGNOSIS — N281 Cyst of kidney, acquired: Secondary | ICD-10-CM | POA: Diagnosis not present

## 2020-12-28 DIAGNOSIS — R918 Other nonspecific abnormal finding of lung field: Secondary | ICD-10-CM | POA: Diagnosis not present

## 2020-12-28 DIAGNOSIS — K5730 Diverticulosis of large intestine without perforation or abscess without bleeding: Secondary | ICD-10-CM | POA: Diagnosis not present

## 2021-01-15 DIAGNOSIS — Z23 Encounter for immunization: Secondary | ICD-10-CM | POA: Diagnosis not present

## 2021-03-03 DIAGNOSIS — N5201 Erectile dysfunction due to arterial insufficiency: Secondary | ICD-10-CM | POA: Diagnosis not present

## 2021-03-03 DIAGNOSIS — Z8546 Personal history of malignant neoplasm of prostate: Secondary | ICD-10-CM | POA: Diagnosis not present

## 2021-03-03 DIAGNOSIS — Z87442 Personal history of urinary calculi: Secondary | ICD-10-CM | POA: Diagnosis not present

## 2021-03-03 DIAGNOSIS — Z8547 Personal history of malignant neoplasm of testis: Secondary | ICD-10-CM | POA: Diagnosis not present

## 2021-04-06 DIAGNOSIS — C4441 Basal cell carcinoma of skin of scalp and neck: Secondary | ICD-10-CM | POA: Diagnosis not present

## 2021-04-06 DIAGNOSIS — D225 Melanocytic nevi of trunk: Secondary | ICD-10-CM | POA: Diagnosis not present

## 2021-04-06 DIAGNOSIS — D2362 Other benign neoplasm of skin of left upper limb, including shoulder: Secondary | ICD-10-CM | POA: Diagnosis not present

## 2021-04-06 DIAGNOSIS — D485 Neoplasm of uncertain behavior of skin: Secondary | ICD-10-CM | POA: Diagnosis not present

## 2021-04-06 DIAGNOSIS — L814 Other melanin hyperpigmentation: Secondary | ICD-10-CM | POA: Diagnosis not present

## 2021-04-06 DIAGNOSIS — L821 Other seborrheic keratosis: Secondary | ICD-10-CM | POA: Diagnosis not present

## 2021-04-06 DIAGNOSIS — C44712 Basal cell carcinoma of skin of right lower limb, including hip: Secondary | ICD-10-CM | POA: Diagnosis not present

## 2021-04-06 DIAGNOSIS — Z85828 Personal history of other malignant neoplasm of skin: Secondary | ICD-10-CM | POA: Diagnosis not present

## 2021-04-29 DIAGNOSIS — W19XXXA Unspecified fall, initial encounter: Secondary | ICD-10-CM | POA: Diagnosis not present

## 2021-04-29 DIAGNOSIS — M79659 Pain in unspecified thigh: Secondary | ICD-10-CM | POA: Diagnosis not present

## 2021-04-29 DIAGNOSIS — Z86718 Personal history of other venous thrombosis and embolism: Secondary | ICD-10-CM | POA: Diagnosis not present

## 2021-04-29 DIAGNOSIS — I10 Essential (primary) hypertension: Secondary | ICD-10-CM | POA: Diagnosis not present

## 2021-04-30 ENCOUNTER — Other Ambulatory Visit: Payer: Self-pay

## 2021-04-30 DIAGNOSIS — Z86718 Personal history of other venous thrombosis and embolism: Secondary | ICD-10-CM

## 2021-04-30 DIAGNOSIS — W19XXXA Unspecified fall, initial encounter: Secondary | ICD-10-CM

## 2021-05-04 ENCOUNTER — Ambulatory Visit
Admission: RE | Admit: 2021-05-04 | Discharge: 2021-05-04 | Disposition: A | Discharge status: Home / Self Care | Payer: Medicare Other | Source: Ambulatory Visit

## 2021-05-04 DIAGNOSIS — W19XXXA Unspecified fall, initial encounter: Secondary | ICD-10-CM

## 2021-05-04 DIAGNOSIS — D6851 Activated protein C resistance: Secondary | ICD-10-CM | POA: Diagnosis not present

## 2021-05-04 DIAGNOSIS — Z86718 Personal history of other venous thrombosis and embolism: Secondary | ICD-10-CM | POA: Diagnosis not present

## 2021-05-11 DIAGNOSIS — C4441 Basal cell carcinoma of skin of scalp and neck: Secondary | ICD-10-CM | POA: Diagnosis not present

## 2021-05-11 DIAGNOSIS — Z85828 Personal history of other malignant neoplasm of skin: Secondary | ICD-10-CM | POA: Diagnosis not present

## 2021-05-25 DIAGNOSIS — I10 Essential (primary) hypertension: Secondary | ICD-10-CM | POA: Diagnosis not present

## 2021-07-20 DIAGNOSIS — B029 Zoster without complications: Secondary | ICD-10-CM | POA: Diagnosis not present

## 2021-07-20 DIAGNOSIS — I10 Essential (primary) hypertension: Secondary | ICD-10-CM | POA: Diagnosis not present

## 2021-10-05 DIAGNOSIS — Z85828 Personal history of other malignant neoplasm of skin: Secondary | ICD-10-CM | POA: Diagnosis not present

## 2021-10-05 DIAGNOSIS — L814 Other melanin hyperpigmentation: Secondary | ICD-10-CM | POA: Diagnosis not present

## 2021-10-05 DIAGNOSIS — R208 Other disturbances of skin sensation: Secondary | ICD-10-CM | POA: Diagnosis not present

## 2021-10-05 DIAGNOSIS — C44719 Basal cell carcinoma of skin of left lower limb, including hip: Secondary | ICD-10-CM | POA: Diagnosis not present

## 2021-10-05 DIAGNOSIS — C44712 Basal cell carcinoma of skin of right lower limb, including hip: Secondary | ICD-10-CM | POA: Diagnosis not present

## 2021-10-05 DIAGNOSIS — L309 Dermatitis, unspecified: Secondary | ICD-10-CM | POA: Diagnosis not present

## 2021-10-20 DIAGNOSIS — Z85828 Personal history of other malignant neoplasm of skin: Secondary | ICD-10-CM | POA: Diagnosis not present

## 2021-10-20 DIAGNOSIS — C44712 Basal cell carcinoma of skin of right lower limb, including hip: Secondary | ICD-10-CM | POA: Diagnosis not present

## 2021-10-20 DIAGNOSIS — C44719 Basal cell carcinoma of skin of left lower limb, including hip: Secondary | ICD-10-CM | POA: Diagnosis not present

## 2021-11-02 DIAGNOSIS — R600 Localized edema: Secondary | ICD-10-CM | POA: Diagnosis not present

## 2021-11-18 DIAGNOSIS — Z125 Encounter for screening for malignant neoplasm of prostate: Secondary | ICD-10-CM | POA: Diagnosis not present

## 2021-11-18 DIAGNOSIS — E7801 Familial hypercholesterolemia: Secondary | ICD-10-CM | POA: Diagnosis not present

## 2021-11-18 DIAGNOSIS — I10 Essential (primary) hypertension: Secondary | ICD-10-CM | POA: Diagnosis not present

## 2021-12-16 DIAGNOSIS — I10 Essential (primary) hypertension: Secondary | ICD-10-CM | POA: Diagnosis not present

## 2021-12-16 DIAGNOSIS — J302 Other seasonal allergic rhinitis: Secondary | ICD-10-CM | POA: Diagnosis not present

## 2021-12-16 DIAGNOSIS — Z0000 Encounter for general adult medical examination without abnormal findings: Secondary | ICD-10-CM | POA: Diagnosis not present

## 2021-12-16 DIAGNOSIS — I700 Atherosclerosis of aorta: Secondary | ICD-10-CM | POA: Diagnosis not present

## 2021-12-17 ENCOUNTER — Other Ambulatory Visit: Payer: Self-pay | Admitting: Internal Medicine

## 2021-12-17 DIAGNOSIS — I10 Essential (primary) hypertension: Secondary | ICD-10-CM

## 2021-12-20 DIAGNOSIS — Z8546 Personal history of malignant neoplasm of prostate: Secondary | ICD-10-CM | POA: Diagnosis not present

## 2021-12-20 DIAGNOSIS — Z8547 Personal history of malignant neoplasm of testis: Secondary | ICD-10-CM | POA: Diagnosis not present

## 2021-12-27 ENCOUNTER — Other Ambulatory Visit (HOSPITAL_COMMUNITY): Payer: Self-pay | Admitting: Nurse Practitioner

## 2021-12-27 ENCOUNTER — Ambulatory Visit (HOSPITAL_COMMUNITY)
Admission: RE | Admit: 2021-12-27 | Discharge: 2021-12-27 | Disposition: A | Discharge status: Home / Self Care | Payer: Medicare Other | Source: Ambulatory Visit | Attending: Nurse Practitioner | Admitting: Nurse Practitioner

## 2021-12-27 DIAGNOSIS — Z8547 Personal history of malignant neoplasm of testis: Secondary | ICD-10-CM | POA: Diagnosis not present

## 2021-12-27 DIAGNOSIS — N281 Cyst of kidney, acquired: Secondary | ICD-10-CM | POA: Diagnosis not present

## 2021-12-27 DIAGNOSIS — K7689 Other specified diseases of liver: Secondary | ICD-10-CM | POA: Diagnosis not present

## 2021-12-27 DIAGNOSIS — M47814 Spondylosis without myelopathy or radiculopathy, thoracic region: Secondary | ICD-10-CM | POA: Diagnosis not present

## 2021-12-27 DIAGNOSIS — C6291 Malignant neoplasm of right testis, unspecified whether descended or undescended: Secondary | ICD-10-CM | POA: Diagnosis not present

## 2022-01-03 DIAGNOSIS — N281 Cyst of kidney, acquired: Secondary | ICD-10-CM | POA: Diagnosis not present

## 2022-01-03 DIAGNOSIS — N5201 Erectile dysfunction due to arterial insufficiency: Secondary | ICD-10-CM | POA: Diagnosis not present

## 2022-01-18 DIAGNOSIS — S61411A Laceration without foreign body of right hand, initial encounter: Secondary | ICD-10-CM | POA: Diagnosis not present

## 2022-01-18 DIAGNOSIS — W010XXA Fall on same level from slipping, tripping and stumbling without subsequent striking against object, initial encounter: Secondary | ICD-10-CM | POA: Diagnosis not present

## 2022-02-02 DIAGNOSIS — S61401A Unspecified open wound of right hand, initial encounter: Secondary | ICD-10-CM | POA: Diagnosis not present

## 2022-02-02 DIAGNOSIS — Z4802 Encounter for removal of sutures: Secondary | ICD-10-CM | POA: Diagnosis not present

## 2022-02-04 DIAGNOSIS — S61411D Laceration without foreign body of right hand, subsequent encounter: Secondary | ICD-10-CM | POA: Diagnosis not present

## 2022-02-09 DIAGNOSIS — Z4789 Encounter for other orthopedic aftercare: Secondary | ICD-10-CM | POA: Diagnosis not present

## 2022-02-09 DIAGNOSIS — M79641 Pain in right hand: Secondary | ICD-10-CM | POA: Diagnosis not present

## 2022-03-09 DIAGNOSIS — J351 Hypertrophy of tonsils: Secondary | ICD-10-CM | POA: Diagnosis not present

## 2022-03-09 DIAGNOSIS — H938X3 Other specified disorders of ear, bilateral: Secondary | ICD-10-CM | POA: Diagnosis not present

## 2022-03-09 DIAGNOSIS — R051 Acute cough: Secondary | ICD-10-CM | POA: Diagnosis not present

## 2022-03-10 ENCOUNTER — Ambulatory Visit
Admission: RE | Admit: 2022-03-10 | Discharge: 2022-03-10 | Disposition: A | Discharge status: Home / Self Care | Payer: No Typology Code available for payment source | Source: Ambulatory Visit | Attending: Internal Medicine | Admitting: Internal Medicine

## 2022-03-10 DIAGNOSIS — I10 Essential (primary) hypertension: Secondary | ICD-10-CM

## 2022-03-11 DIAGNOSIS — I2510 Atherosclerotic heart disease of native coronary artery without angina pectoris: Secondary | ICD-10-CM | POA: Diagnosis not present

## 2022-04-11 DIAGNOSIS — H903 Sensorineural hearing loss, bilateral: Secondary | ICD-10-CM | POA: Diagnosis not present

## 2022-04-13 DIAGNOSIS — H52223 Regular astigmatism, bilateral: Secondary | ICD-10-CM | POA: Diagnosis not present

## 2022-04-13 DIAGNOSIS — H25012 Cortical age-related cataract, left eye: Secondary | ICD-10-CM | POA: Diagnosis not present

## 2022-04-14 DIAGNOSIS — C44719 Basal cell carcinoma of skin of left lower limb, including hip: Secondary | ICD-10-CM | POA: Diagnosis not present

## 2022-04-14 DIAGNOSIS — L570 Actinic keratosis: Secondary | ICD-10-CM | POA: Diagnosis not present

## 2022-04-14 DIAGNOSIS — Z85828 Personal history of other malignant neoplasm of skin: Secondary | ICD-10-CM | POA: Diagnosis not present

## 2022-04-14 DIAGNOSIS — L814 Other melanin hyperpigmentation: Secondary | ICD-10-CM | POA: Diagnosis not present

## 2022-04-14 DIAGNOSIS — L821 Other seborrheic keratosis: Secondary | ICD-10-CM | POA: Diagnosis not present

## 2022-05-19 DIAGNOSIS — H2512 Age-related nuclear cataract, left eye: Secondary | ICD-10-CM | POA: Diagnosis not present

## 2022-05-19 DIAGNOSIS — H35371 Puckering of macula, right eye: Secondary | ICD-10-CM | POA: Diagnosis not present

## 2022-05-30 DIAGNOSIS — H269 Unspecified cataract: Secondary | ICD-10-CM | POA: Diagnosis not present

## 2022-05-30 DIAGNOSIS — H2512 Age-related nuclear cataract, left eye: Secondary | ICD-10-CM | POA: Diagnosis not present

## 2022-06-02 ENCOUNTER — Ambulatory Visit: Payer: Medicare Other | Admitting: Internal Medicine

## 2022-06-02 NOTE — Progress Notes (Incomplete)
Cardiology Office Note:    Date:  06/02/22  ID:  Craig Booth, DOB 09-08-1950, MRN [id number]  PCP:  Craig Pretty, MD  Cardiologist:  None  Electrophysiologist:  None   Referring MD: Craig Pretty, MD   Chief Complaint/Reason for Referral: Agatson score greater than 400 needing cardiology work up.   History of Present Illness:    Craig Booth is a 71 y.o. male with a history of atherosclerosis of native coronary artery of native heart without angina pectoris.  He was last seen by Craig Pretty, MD on 03/25/2022 and was referred for having an agatson score greater than 400. He was quite asymptomatic and was recommended to not increase exercise level until a cardiology work up is completed.   Today,   He denies any palpitations, chest pain, shortness of breath, or peripheral edema. No lightheadedness, headaches, syncope, orthopnea, or PND.   (+)  Past Medical History:  Diagnosis Date   Anticoagulated on Coumadin    Auditory vertigo involving left ear VIRAL EAR INFECTION IMPROVING   Compressed vertebrae    YRS AGO--  LUMBAR   Factor V Leiden mutation (Craig Booth)    MONTIORED BY DR Craig Booth W/ [facility]   History of basal cell carcinoma excision    History of DVT of lower extremity RIGHT /NOV 2011   and right groin   History of kidney stones    History of phlebitis NOV 2011   Hypercholesterolemia    improved with diet, not taking medication   Hypertension    Incomplete RBBB    Migraine    Organic impotence    Pneumonia    Prostate cancer (Craig Booth) 06/25/2012   Adenocarcinoma,gleason=3+3=6,PSA=3.8,volume=44cc   Transient global amnesia     Past Surgical History:  Procedure Laterality Date   COLONOSCOPY  2012   CYSTOSCOPY N/A 11/08/2012   Procedure: CYSTOSCOPY FLEXIBLE;  Surgeon: Craig So, MD;  Location: [facility];  Service: Urology;  Laterality: N/A;  NO SEEDS FOUND IN BLADDER   CYSTOSCOPY WITH RETROGRADE PYELOGRAM, URETEROSCOPY AND STENT PLACEMENT  Right 10/01/2014   Procedure: CYSTOSCOPY WITH RETROGRADE PYELOGRAM, URETEROSCOPY, STONE REMOVAL, AND STENT PLACEMENT;  Surgeon: Craig Bring, MD;  Location: [facility];  Service: Urology;  Laterality: Right;   HOLMIUM LASER APPLICATION Right 9/41/7408   Procedure: HOLMIUM LASER APPLICATION;  Surgeon: Craig Bring, MD;  Location: [facility];  Service: Urology;  Laterality: Right;   KIDNEY STONE SURGERY     ORCHIECTOMY Right 05/22/2018   Procedure: ORCHIECTOMY, RADICAL;  Surgeon: Craig Seal, MD;  Location: [facility];  Service: Urology;  Laterality: Right;   PROSTATE BIOPSY  06/25/2012   Adenocarcinoma   RADIOACTIVE SEED IMPLANT N/A 11/08/2012   Procedure:  63 RADIOACTIVE SEED IMPLANT;  Surgeon: Craig So, MD;  Location: [facility];  Service: Urology;  Laterality: N/A;      SEEDS IMPLANTED   TONSILLECTOMY  AGE 46   UMBILICAL HERNIA REPAIR  02/10/2011    Current Medications: No outpatient medications have been marked as taking for the 06/02/22 encounter (Appointment) with Craig Munroe, MD.     Allergies:   Betadine [povidone iodine], Amoxicillin, Bee venom, and Chlorthalidone   Social History   Tobacco Use   Smoking status: Never   Smokeless tobacco: Never  Vaping Use   Vaping Use: Never used  Substance Use Topics   Alcohol use: Not Currently   Drug use: No     Family History: The patient's family history includes Cancer in his father;  Dementia in his father and mother.  ROS:   Please see the history of present illness.     All other systems reviewed and are negative.  EKGs/Labs/Other Studies Reviewed:    The following studies were reviewed today:  EKG:  EKG is personally reviewed.  06/02/2022: Sinus ***. Rate *** bpm.  Echo 06/04/2020:  IMPRESSIONS   1. Left ventricular ejection fraction, by estimation, is 60 to 65%. The  left ventricle has normal function. The left ventricle has no regional  wall motion abnormalities. Left ventricular diastolic  parameters are  consistent with Grade I diastolic  dysfunction (impaired relaxation). The average left ventricular global  longitudinal strain is -19.5 %. The global longitudinal strain is normal.   2. Right ventricular systolic function is normal. The right ventricular  size is normal.   3. The mitral valve is normal in structure. Trivial mitral valve  regurgitation. No evidence of mitral stenosis.   4. The aortic valve is tricuspid. There is mild thickening of the aortic  valve. Aortic valve regurgitation is not visualized.   5. Aortic dilatation noted. There is mild dilatation of the aortic root,  measuring 38 mm.   6. The inferior vena cava is normal in size with greater than 50%  respiratory variability, suggesting right atrial pressure of 3 mmHg.   7. No atrial level shunt detected by color flow Doppler.    Recent Labs: No results found for requested labs within last 365 days.  Recent Lipid Panel No results found for: "CHOL", "TRIG", "HDL", "CHOLHDL", "VLDL", "LDLCALC", "LDLDIRECT"  Physical Exam:    VS:  There were no vitals taken for this visit.    Wt Readings from Last 5 Encounters:  12/24/20 229 lb (103.9 kg)  05/28/20 223 lb (101.2 kg)  05/23/20 218 lb (98.9 kg)  02/18/20 220 lb (99.8 kg)  02/11/20 220 lb (99.8 kg)    Constitutional: No acute distress Eyes: sclera non-icteric, normal conjunctiva and lids ENMT: normal dentition, moist mucous membranes Cardiovascular: *** regular rhythm, normal rate, no murmur. S1 and S2 normal. No jugular venous distention.  Respiratory: clear to auscultation bilaterally GI : normal bowel sounds, soft and nontender. No distention.   MSK: extremities warm, well perfused. No edema.  NEURO: grossly nonfocal exam, moves all extremities. PSYCH: alert and oriented x 3, normal mood and affect.   ASSESSMENT:    No diagnosis found. PLAN:    *** Plan:   Total time of encounter: *** minutes total time of encounter, including ***  minutes spent in face-to-face patient care on the date of this encounter. This time includes coordination of care and counseling regarding above mentioned problem list. Remainder of non-face-to-face time involved reviewing chart documents/testing relevant to the patient encounter and documentation in the medical record. I have independently reviewed documentation from referring provider.   Cherlynn Kaiser, MD, [place]   Shared Decision Making/Informed Consent:   {Are you ordering a CV Procedure (e.g. stress test, cath, DCCV, TEE, etc)?   Press F2        :[id number]}   Medication Adjustments/Labs and Tests Ordered: Current medicines are reviewed at length with the patient today.  Concerns regarding medicines are outlined above.   No orders of the defined types were placed in this encounter.  No orders of the defined types were placed in this encounter.  There are no Patient Instructions on file for this visit.    I,Rachel Rivera,acting as a scribe for Craig Munroe, MD.,have  documented all relevant documentation on the behalf of Craig Munroe, MD,as directed by  Craig Munroe, MD while in the presence of Craig Munroe, MD.  ***

## 2022-06-22 NOTE — Progress Notes (Signed)
Cardiology Office Note:    Date: 06/23/2022  ID:  Craig Booth, DOB [birth year], MRN [id number]  PCP:  Deland Pretty, MD  Cardiologist:  Elouise Munroe, MD  Electrophysiologist:  None   Referring MD: Deland Pretty, MD   Chief Complaint/Reason for Referral: Coronary artery calcifications  History of Present Illness:    Craig Booth is a 72 y.o. male with a history of incomplete RBBB, recurrent DVT's, phlebitis, factor V Leiden mutation, hypertension, hyperlipidemia, pneumonia, nephrolithiasis, prostate cancer, basal cell carcinoma, and transient global amnesia, here for the evaluation of CAC.  Referral notes from Dr. Shelia Media personally reviewed. At their visit 03/11/22 they discussed his CT calcium scoring performed for risk stratification. This revealed a coronary calcium score of 561 with atherosclerotic calcifications about the left anterior descending and circumflex coronary arteries.  Today, he is accompanied by his wife. We reviewed the results of his CT calcium score in detail.   He denies any issues with chest pain with exertion. In the last 2 weeks he had "the worst cold of his life", during which he experienced a chest discomfort just inferior to his sternum, primarily with coughing. This was described as "uncomfortable" and was secondary to severe coughing. Currently he still has a lingering cough, but his chest discomfort resolved.   He monitors his weight daily. Lately he has gained about 8 lbs, and wishes to return to exercising again. For activity he often goes walking/hiking as [place]. They made 4 trips last year, most recently in October. During that trip he was wearing a 20-lb pack and walking 5-6 miles per day. No anginal symptoms.  In his family, his father, grandfather, uncle, and two great uncles, all had some form of Alzheimer's or dementia at an early age. So far he has no signs of similar disease but he is very concerned as he is now  older than his family members were at their onset. Mother had vascular dementia, died at 38. His father had a heart attack in his 22's.  He states that his initial DVT was found after he suffered an injury while exercising at the [facility]. He had been pushing himself with lifting heavier weights.  The patient denies dyspnea at rest or with exertion, palpitations, PND, orthopnea, or leg swelling. Denies fever, chills. Denies nausea, vomiting. Denies syncope or presyncope. Denies dizziness or lightheadedness. Denies snoring.    Past Medical History:  Diagnosis Date   Anticoagulated on Coumadin    Auditory vertigo involving left ear VIRAL EAR INFECTION IMPROVING   Compressed vertebrae    YRS AGO--  LUMBAR   Factor V Leiden mutation ([place])    MONTIORED BY DR Glennon Hamilton W/ [facility]   History of basal cell carcinoma excision    History of DVT of lower extremity RIGHT /NOV 2011   and right groin   History of kidney stones    History of phlebitis NOV 2011   Hypercholesterolemia    improved with diet, not taking medication   Hypertension    Incomplete RBBB    Migraine    Organic impotence    Pneumonia    Prostate cancer ([place]) 06/25/2012   Adenocarcinoma,gleason=3+3=6,PSA=3.8,volume=44cc   Transient global amnesia     Past Surgical History:  Procedure Laterality Date   COLONOSCOPY  2012   CYSTOSCOPY N/A 11/08/2012   Procedure: CYSTOSCOPY FLEXIBLE;  Surgeon: Malka So, MD;  Location: [facility];  Service: Urology;  Laterality: N/A;  NO SEEDS FOUND IN BLADDER  CYSTOSCOPY WITH RETROGRADE PYELOGRAM, URETEROSCOPY AND STENT PLACEMENT Right 10/01/2014   Procedure: CYSTOSCOPY WITH RETROGRADE PYELOGRAM, URETEROSCOPY, STONE REMOVAL, AND STENT PLACEMENT;  Surgeon: Raynelle Bring, MD;  Location: [facility];  Service: Urology;  Laterality: Right;   HOLMIUM LASER APPLICATION Right 7/90/2409   Procedure: HOLMIUM LASER APPLICATION;  Surgeon: Raynelle Bring, MD;  Location: [facility];  Service: Urology;   Laterality: Right;   KIDNEY STONE SURGERY     ORCHIECTOMY Right 05/22/2018   Procedure: ORCHIECTOMY, RADICAL;  Surgeon: Irine Seal, MD;  Location: [facility];  Service: Urology;  Laterality: Right;   PROSTATE BIOPSY  06/25/2012   Adenocarcinoma   RADIOACTIVE SEED IMPLANT N/A 11/08/2012   Procedure:  23 RADIOACTIVE SEED IMPLANT;  Surgeon: Malka So, MD;  Location: [facility];  Service: Urology;  Laterality: N/A;      SEEDS IMPLANTED   TONSILLECTOMY  AGE 43   UMBILICAL HERNIA REPAIR  02/10/2011    Current Medications: Current Meds  Medication Sig   atorvastatin (LIPITOR) 10 MG tablet Take 10 mg by mouth daily.   hydrochlorothiazide (HYDRODIURIL) 25 MG tablet Take 12.5 mg by mouth daily.   Magnesium 400 MG CAPS Take 1 capsule by mouth every other day.   metoprolol tartrate (LOPRESSOR) 25 MG tablet Take 1 tablet (25 mg total) by mouth once for 1 dose. PLEASE TAKE METOPROLOL 2  HOURS PRIOR TO CTA SCAN. DO NOT TAKE IF HR IS LESS THAN 60   Multiple Vitamin (MULTIVITAMIN) capsule Take 1 capsule by mouth daily.   vitamin C (ASCORBIC ACID) 500 MG tablet Take 500 mg by mouth daily.   XARELTO 20 MG TABS tablet Take 20 mg by mouth daily.     Allergies:   Betadine [povidone iodine], Amoxicillin, Bee venom, and Chlorthalidone   Social History   Tobacco Use   Smoking status: Never   Smokeless tobacco: Never  Vaping Use   Vaping Use: Never used  Substance Use Topics   Alcohol use: Not Currently   Drug use: No     Family History: The patient's family history includes Cancer in his father; Dementia in his father and mother.  ROS:   Please see the history of present illness.    (+) Cough All other systems reviewed and are negative.  EKGs/Labs/Other Studies Reviewed:    The following studies were reviewed today:  CT Calcium Scoring  03/10/2022: CORONARY CALCIUM SCORES:   Left Main: 0   LAD: 540   LCx: 21.1   RCA: 0   Total Agatston Score: 561   MESA database  percentile: Seventy-fifth   AORTA MEASUREMENTS: Ascending Aorta: 35 mm   Descending Aorta: 30 mm   OTHER FINDINGS: Cardiovascular: The heart is normal in size.   Mediastinum/Nodes: Visualized esophagus and trachea within normal limits. No mediastinal lymphadenopathy.   Lungs/Pleura: The lungs are clear bilaterally.   Upper Abdomen: The visualized upper abdomen is within normal limits.   Musculoskeletal: No acute osseous abnormality.   IMPRESSION: Atherosclerotic calcifications about the left anterior descending and circumflex coronary arteries. The observed calcium score of 561 is at the seventy-fifth percentile for subjects of the same age, gender, and race/ethnicity who are free of clinical cardiovascular disease and treated diabetes.  Left LE Venous Doppler  05/04/2021: IMPRESSION: No evidence of femoropopliteal DVT within the LEFT lower extremity.  Echo  06/04/2020:  1. Left ventricular ejection fraction, by estimation, is 60 to 65%. The  left ventricle has normal function. The left ventricle has no regional  wall motion abnormalities. Left ventricular diastolic parameters are  consistent with Grade I diastolic  dysfunction (impaired relaxation). The average left ventricular global  longitudinal strain is -19.5 %. The global longitudinal strain is normal.   2. Right ventricular systolic function is normal. The right ventricular  size is normal.   3. The mitral valve is normal in structure. Trivial mitral valve  regurgitation. No evidence of mitral stenosis.   4. The aortic valve is tricuspid. There is mild thickening of the aortic  valve. Aortic valve regurgitation is not visualized.   5. Aortic dilatation noted. There is mild dilatation of the aortic root,  measuring 38 mm.   6. The inferior vena cava is normal in size with greater than 50%  respiratory variability, suggesting right atrial pressure of 3 mmHg.   7. No atrial level shunt detected by color flow  Doppler.   Comparison(s): No prior Echocardiogram.   EKG:  EKG is personally reviewed. 06/23/2022: Sinus rhythm. Nonspecific IVCD.  Imaging studies that I have independently reviewed today: Ct cor cal, reviewed images with patient in room.  Recent Labs: 06/23/2022: BUN 20; Creatinine, Ser 1.04; Potassium 4.2; Sodium 142   Recent Lipid Panel No results found for: "CHOL", "TRIG", "HDL", "CHOLHDL", "VLDL", "LDLCALC", "LDLDIRECT"  Physical Exam:    VS:  BP 124/88   Pulse 68   Ht '6\' 3"'$  (1.905 m)   Wt 230 lb 12.8 oz (104.7 kg)   SpO2 94%   BMI 28.85 kg/m     Wt Readings from Last 5 Encounters:  06/23/22 230 lb 12.8 oz (104.7 kg)  12/24/20 229 lb (103.9 kg)  05/28/20 223 lb (101.2 kg)  05/23/20 218 lb (98.9 kg)  02/18/20 220 lb (99.8 kg)    Constitutional: No acute distress Eyes: sclera non-icteric, normal conjunctiva and lids ENMT: normal dentition, moist mucous membranes Cardiovascular: regular rhythm, normal rate, no murmur. S1 and S2 normal. No jugular venous distention.  Respiratory: clear to auscultation bilaterally GI : normal bowel sounds, soft and nontender. No distention.   MSK: extremities warm, well perfused. No edema.  NEURO: grossly nonfocal exam, moves all extremities. PSYCH: alert and oriented x 3, normal mood and affect.   ASSESSMENT:    1. Precordial pain   2. Primary hypertension   3. Factor V Leiden ([place])   4. Deep vein thrombosis (DVT) of lower extremity, unspecified chronicity, unspecified laterality, unspecified vein (HCC)    PLAN:    Precordial pain - Plan: EKG 12-Lead, CT CORONARY MORPH W/CTA COR W/SCORE W/CA W/CM &/OR WO/CM, Basic metabolic panel  Primary hypertension  Factor V Leiden ([place])  Deep vein thrombosis (DVT) of lower extremity, unspecified chronicity, unspecified laterality, unspecified vein (HCC) - possible cardiac chest, most likely MSK in setting of coughing. Given high risk calcium score stratification (>400), will pursue  anatomic testing with CCTA.  - cont HCTZ 25 mg daily - continues on xarelto 20 mg daily for hx of recurrent DVT and factor 5 leiden mutation.  - cont atorvastatin 10 mg daily, lipids controlled and followed by Dr. Shelia Media.   Total time of encounter: 45 minutes total time of encounter, including 30 minutes spent in face-to-face patient care on the date of this encounter. This time includes coordination of care and counseling regarding above mentioned problem list. Remainder of non-face-to-face time involved reviewing chart documents/testing relevant to the patient encounter and documentation in the medical record. I have independently reviewed documentation from referring provider.   Cherlynn Kaiser, MD, [place]  [facility]   Shared Decision Making/Informed Consent:       Medication Adjustments/Labs and Tests Ordered: Current medicines are reviewed at length with the patient today.  Concerns regarding medicines are outlined above.   Orders Placed This Encounter  Procedures   CT CORONARY MORPH W/CTA COR W/SCORE W/CA W/CM &/OR WO/CM   Basic metabolic panel   EKG 16-WVPX   Meds ordered this encounter  Medications   metoprolol tartrate (LOPRESSOR) 25 MG tablet    Sig: Take 1 tablet (25 mg total) by mouth once for 1 dose. PLEASE TAKE METOPROLOL 2  HOURS PRIOR TO CTA SCAN. DO NOT TAKE IF HR IS LESS THAN 60    Dispense:  1 tablet    Refill:  0   Patient Instructions  Medication Instructions:  PLEASE TAKE METOPROLOL TARTRATE '25mg'$  TWO HOURS BEFORE CCTA SCAN- PLEASE DO NOT TAKE IF YOUR HEART RATE IS LESS THAN 60 *If you need a refill on your cardiac medications before your next appointment, please call your pharmacy*  Lab Work: BMET- TODAY  If you have labs (blood work) drawn today and your tests are completely normal, you will receive your results only by: MyChart Message (if you have MyChart) OR A paper copy in the mail If you have any lab test that is abnormal or we need  to change your treatment, we will call you to review the results.  Testing/Procedures: Your physician has requested that you have cardiac CT. Cardiac computed tomography (CT) is a painless test that uses an x-ray machine to take clear, detailed pictures of your heart. For further information please visit HugeFiesta.tn. Please follow instruction sheet as given.  Follow-Up: At [facility], you and your health needs are our priority.  As part of our continuing mission to provide you with exceptional heart care, we have created designated Provider Care Teams.  These Care Teams include your primary Cardiologist (physician) and Advanced Practice Providers (APPs -  Physician Assistants and Nurse Practitioners) who all work together to provide you with the care you need, when you need it.  Your next appointment:   FEBRUARY 21st at 1:40pm   Provider:   Elouise Munroe, MD  Other Instructions   Your cardiac CT will be scheduled at one of the below locations:   [facility] [address] [place], [place] [address] [phone]  If scheduled at [facility], please arrive at the [facility] and Children's Entrance (Entrance C2) of [facility] 30 minutes prior to test start time. You can use the FREE valet parking offered at entrance C (encouraged to control the heart rate for the test)  Proceed to the [facility] Radiology Department (first floor) to check-in and test prep.  All radiology patients and guests should use entrance C2 at [facility], accessed from [facility], even though the hospital's physical address listed is [address]      Please follow these instructions carefully (unless otherwise directed):  Hold all erectile dysfunction medications at least 3 days (72 hrs) prior to test. (Ie viagra, cialis, sildenafil, tadalafil, etc) We will administer nitroglycerin during this exam.   On the Night Before the  Test: Be sure to Drink plenty of water. Do not consume any caffeinated/decaffeinated beverages or chocolate 12 hours prior to your test. Do not take any antihistamines 12 hours prior to your test.  On the Day of the Test: Drink plenty of water until 1 hour prior to the  test. Do not eat any food 1 hour prior to test. You may take your regular medications prior to the test.  Take metoprolol (Lopressor) two hours prior to test. HOLD Furosemide/Hydrochlorothiazide morning of the test.      After the Test: Drink plenty of water. After receiving IV contrast, you may experience a mild flushed feeling. This is normal. On occasion, you may experience a mild rash up to 24 hours after the test. This is not dangerous. If this occurs, you can take Benadryl 25 mg and increase your fluid intake. If you experience trouble breathing, this can be serious. If it is severe call 911 IMMEDIATELY. If it is mild, please call our office. If you take any of these medications: Glipizide/Metformin, Avandament, Glucavance, please do not take 48 hours after completing test unless otherwise instructed.  We will call to schedule your test 2-4 weeks out understanding that some insurance companies will need an authorization prior to the service being performed.   For non-scheduling related questions, please contact the cardiac imaging nurse navigator should you have any questions/concerns: Marchia Bond, Cardiac Imaging Nurse Navigator Gordy Clement, Cardiac Imaging Nurse Navigator [facility] Direct Office Dial: [phone]   For scheduling needs, including cancellations and rescheduling, please call [place], [phone].    I,Mathew Stumpf,acting as a Education administrator for Elouise Munroe, MD.,have documented all relevant documentation on the behalf of Elouise Munroe, MD,as directed by  Elouise Munroe, MD while in the presence of Elouise Munroe, MD.  I, Elouise Munroe, MD, have  reviewed all documentation for the visit on 06/23/2022. The documentation on today's date of service for the exam, diagnosis, procedures, and orders are all accurate and complete.

## 2022-06-23 ENCOUNTER — Encounter: Payer: Self-pay | Admitting: Internal Medicine

## 2022-06-23 ENCOUNTER — Ambulatory Visit: Payer: Medicare Other | Attending: Internal Medicine | Admitting: Internal Medicine

## 2022-06-23 VITALS — BP 124/88 | HR 68 | Ht 75.0 in | Wt 230.8 lb

## 2022-06-23 DIAGNOSIS — D6851 Activated protein C resistance: Secondary | ICD-10-CM | POA: Diagnosis not present

## 2022-06-23 DIAGNOSIS — I10 Essential (primary) hypertension: Secondary | ICD-10-CM | POA: Diagnosis not present

## 2022-06-23 DIAGNOSIS — R072 Precordial pain: Secondary | ICD-10-CM

## 2022-06-23 DIAGNOSIS — I82409 Acute embolism and thrombosis of unspecified deep veins of unspecified lower extremity: Secondary | ICD-10-CM | POA: Diagnosis not present

## 2022-06-23 LAB — BASIC METABOLIC PANEL
BUN/Creatinine Ratio: 19 (ref 10–24)
BUN: 20 mg/dL (ref 8–27)
CO2: 24 mmol/L (ref 20–29)
Calcium: 9.8 mg/dL (ref 8.6–10.2)
Chloride: 104 mmol/L (ref 96–106)
Creatinine, Ser: 1.04 mg/dL (ref 0.76–1.27)
Glucose: 96 mg/dL (ref 70–99)
Potassium: 4.2 mmol/L (ref 3.5–5.2)
Sodium: 142 mmol/L (ref 134–144)
eGFR: 77 mL/min/{1.73_m2} (ref >59)

## 2022-06-23 MED ORDER — METOPROLOL TARTRATE 25 MG PO TABS: 25.0000 mg | ORAL_TABLET | Freq: Once | ORAL | 0 refills | Status: DC

## 2022-06-23 NOTE — Patient Instructions (Addendum)
Medication Instructions:  PLEASE TAKE METOPROLOL TARTRATE '25mg'$  TWO HOURS BEFORE CCTA SCAN- PLEASE DO NOT TAKE IF YOUR HEART RATE IS LESS THAN 60 *If you need a refill on your cardiac medications before your next appointment, please call your pharmacy*  Lab Work: BMET- TODAY  If you have labs (blood work) drawn today and your tests are completely normal, you will receive your results only by: MyChart Message (if you have MyChart) OR A paper copy in the mail If you have any lab test that is abnormal or we need to change your treatment, we will call you to review the results.  Testing/Procedures: Your physician has requested that you have cardiac CT. Cardiac computed tomography (CT) is a painless test that uses an x-ray machine to take clear, detailed pictures of your heart. For further information please visit HugeFiesta[employer]tn. Please follow instruction sheet as given.  Follow-Up: At [facility], you and your health needs are our priority.  As part of our continuing mission to provide you with exceptional heart care, we have created designated Provider Care Teams.  These Care Teams include your primary Cardiologist (physician) and Advanced Practice Providers (APPs -  Physician Assistants and Nurse Practitioners) who all work together to provide you with the care you need, when you need it.  Your next appointment:   FEBRUARY 21st at 1:40pm   Provider:   Elouise Munroe, MD  Other Instructions   Your cardiac CT will be scheduled at one of the below locations:   [facility] [address] [place], [place] [address] [phone]  If scheduled at [facility], please arrive at the [facility] and Children's Entrance (Entrance C2) of [facility] 30 minutes prior to test start time. You can use the FREE valet parking offered at entrance C (encouraged to control the heart rate for the test)  Proceed to the [facility] Radiology Department (first floor)  to check-in and test prep.  All radiology patients and guests should use entrance C2 at [facility], accessed from [facility], even though the hospital's physical address listed is [address].      Please follow these instructions carefully (unless otherwise directed):  Hold all erectile dysfunction medications at least 3 days (72 hrs) prior to test. (Ie viagra, cialis, sildenafil, tadalafil, etc) We will administer nitroglycerin during this exam.   On the Night Before the Test: Be sure to Drink plenty of water. Do not consume any caffeinated/decaffeinated beverages or chocolate 12 hours prior to your test. Do not take any antihistamines 12 hours prior to your test.  On the Day of the Test: Drink plenty of water until 1 hour prior to the test. Do not eat any food 1 hour prior to test. You may take your regular medications prior to the test.  Take metoprolol (Lopressor) two hours prior to test. HOLD Furosemide/Hydrochlorothiazide morning of the test.      After the Test: Drink plenty of water. After receiving IV contrast, you may experience a mild flushed feeling. This is normal. On occasion, you may experience a mild rash up to 24 hours after the test. This is not dangerous. If this occurs, you can take Benadryl 25 mg and increase your fluid intake. If you experience trouble breathing, this can be serious. If it is severe call 911 IMMEDIATELY. If it is mild, please call our office. If you take any of these medications: Glipizide/Metformin, Avandament, Glucavance, please do not take 48 hours  after completing test unless otherwise instructed.  We will call to schedule your test 2-4 weeks out understanding that some insurance companies will need an authorization prior to the service being performed.   For non-scheduling related questions, please contact the cardiac imaging nurse navigator should you have any questions/concerns: Marchia Bond, Cardiac  Imaging Nurse Navigator Gordy Clement, Cardiac Imaging Nurse Navigator [facility] Direct Office Dial: [phone]   For scheduling needs, including cancellations and rescheduling, please call [place], [phone].

## 2022-06-27 ENCOUNTER — Telehealth (HOSPITAL_COMMUNITY): Payer: Self-pay | Admitting: Emergency Medicine

## 2022-06-27 NOTE — Telephone Encounter (Signed)
Reaching out to patient to offer assistance regarding upcoming cardiac imaging study; pt verbalizes understanding of appt date/time, parking situation and where to check in, pre-test NPO status and medications ordered, and verified current allergies; name and call back number provided for further questions should they arise Craig Bond RN Navigator Cardiac Imaging [facility] [phone] office [phone] cell  Arrival 1200 WC entrance  '25mg'$  metoprolol tartrate  Deneis iv issues  Aware contrast/ nitro

## 2022-06-29 ENCOUNTER — Ambulatory Visit (HOSPITAL_COMMUNITY)
Admission: RE | Admit: 2022-06-29 | Discharge: 2022-06-29 | Disposition: A | Discharge status: Home / Self Care | Payer: Medicare Other | Source: Ambulatory Visit | Attending: Internal Medicine | Admitting: Internal Medicine

## 2022-06-29 DIAGNOSIS — R072 Precordial pain: Secondary | ICD-10-CM

## 2022-06-29 MED ORDER — NITROGLYCERIN 0.4 MG SL SUBL: 0.8000 mg | SUBLINGUAL_TABLET | SUBLINGUAL | Status: DC | PRN

## 2022-06-29 MED ORDER — IOHEXOL 350 MG/ML SOLN
100.0000 mL | Freq: Once | INTRAVENOUS | Status: AC | PRN
  Administered 2022-06-29: 100 mL via INTRAVENOUS

## 2022-06-29 MED ORDER — NITROGLYCERIN 0.4 MG SL SUBL
SUBLINGUAL_TABLET | SUBLINGUAL | Status: AC
  Administered 2022-06-29: 0.8 mg via SUBLINGUAL
  Filled 2022-06-29: qty 2

## 2022-07-01 DIAGNOSIS — I10 Essential (primary) hypertension: Secondary | ICD-10-CM | POA: Diagnosis not present

## 2022-07-01 DIAGNOSIS — R053 Chronic cough: Secondary | ICD-10-CM | POA: Diagnosis not present

## 2022-07-20 DIAGNOSIS — I10 Essential (primary) hypertension: Secondary | ICD-10-CM | POA: Diagnosis not present

## 2022-07-20 DIAGNOSIS — R058 Other specified cough: Secondary | ICD-10-CM | POA: Diagnosis not present

## 2022-07-20 DIAGNOSIS — H9202 Otalgia, left ear: Secondary | ICD-10-CM | POA: Diagnosis not present

## 2022-08-03 ENCOUNTER — Ambulatory Visit: Payer: Medicare Other | Attending: Internal Medicine | Admitting: Internal Medicine

## 2022-08-03 ENCOUNTER — Encounter: Payer: Self-pay | Admitting: Internal Medicine

## 2022-08-03 VITALS — BP 126/76 | HR 88 | Ht 74.0 in | Wt 220.8 lb

## 2022-08-03 DIAGNOSIS — I10 Essential (primary) hypertension: Secondary | ICD-10-CM | POA: Diagnosis not present

## 2022-08-03 DIAGNOSIS — I82409 Acute embolism and thrombosis of unspecified deep veins of unspecified lower extremity: Secondary | ICD-10-CM

## 2022-08-03 DIAGNOSIS — R931 Abnormal findings on diagnostic imaging of heart and coronary circulation: Secondary | ICD-10-CM

## 2022-08-03 DIAGNOSIS — D6851 Activated protein C resistance: Secondary | ICD-10-CM | POA: Diagnosis not present

## 2022-08-03 DIAGNOSIS — R072 Precordial pain: Secondary | ICD-10-CM | POA: Diagnosis not present

## 2022-08-03 DIAGNOSIS — I2510 Atherosclerotic heart disease of native coronary artery without angina pectoris: Secondary | ICD-10-CM

## 2022-08-03 NOTE — Progress Notes (Addendum)
Cardiology Office Note:    Date: 08/03/2022  ID:  Craig Booth, DOB 1951/02/26, MRN [id number]  PCP:  Craig Pretty, MD  Cardiologist:  Craig Munroe, MD  Electrophysiologist:  None   Referring MD: Craig Pretty, MD   Chief Complaint/Reason for Referral: Coronary artery calcifications  History of Present Illness:    Craig Booth is a 72 y.o. male with a history of incomplete RBBB, recurrent DVT's, phlebitis, factor V Leiden mutation, hypertension, hyperlipidemia, pneumonia, nephrolithiasis, prostate cancer, basal cell carcinoma, and transient global amnesia, here for follow-up.   Referral notes from Dr. Shelia Booth personally reviewed. At their visit 03/11/22 they discussed his CT calcium scoring performed for risk stratification. This revealed a coronary calcium score of 561 with atherosclerotic calcifications about the left anterior descending and circumflex coronary arteries.  At his last appointment he complained of "the worst cold of his life" in the prior 2 weeks, during which he experienced a chest discomfort just inferior to his sternum, primarily with coughing. He still had a lingering cough, but his chest discomfort resolved. We reviewed his family history: In his family, his father, grandfather, uncle, and two great uncles, all had some form of Alzheimer's or dementia at an early age. Booth far he had no signs of similar disease but he was very concerned as he was now older than his family members were at their onset. Mother had vascular dementia, died at 75. His father had a heart attack in his 64's.   He had a coronary CTA 06/29/2022 that revealed a coronary calcium score of 576 (74th percentile) and nonobstructive CAD. Mixed plaque in proximal LAD causes mild (25-49%) stenosis. Calcified plaque causes minimal (0-24%) stenosis in mid LAD and proximal/mid LCX.   He is accompanied by his wife. Today, we reviewed the results of his coronary CTA in detail. He has been motivated to  start dieting which he states is working well. Following a "vaguely" Mediterranean diet focusing on limiting his salt, saturated fats, and sugars. Fruit and nuts each day, half the cereal and milk. He lost 7 lbs since 07/13/22. Lately he is feeling less hunger but is still eating well.  His other main concern today is pain in his left ribs that is occurring with deep breathing. He does not consider this to be a cardiac chest pain. Significant coughing with his URI likely contributor.  We reviewed his recent lab work showing an LDL of 61. He notes that it is difficult to tell if his myalgias worsen due to his statin therapy given his chronic back pain. However, he is still willing to trial an increase in his atorvastatin if labs have not improved with dietary changes. Discussed aggressive goal LDL of <55.   He denies any palpitations, shortness of breath, or peripheral edema. No lightheadedness, headaches, syncope, orthopnea, or PND.   Past Medical History:  Diagnosis Date   Anticoagulated on Coumadin    Auditory vertigo involving left ear VIRAL EAR INFECTION IMPROVING   Compressed vertebrae    YRS AGO--  LUMBAR   Factor V Leiden mutation ([place])    MONTIORED BY DR Craig Booth W/ [facility]   History of basal cell carcinoma excision    History of DVT of lower extremity RIGHT /NOV 2011   and right groin   History of kidney stones    History of phlebitis NOV 2011   Hypercholesterolemia    improved with diet, not taking medication   Hypertension    Incomplete RBBB    Migraine  Organic impotence    Pneumonia    Prostate cancer ([place]) 06/25/2012   Adenocarcinoma,gleason=3+3=6,PSA=3.8,volume=44cc   Transient global amnesia     Past Surgical History:  Procedure Laterality Date   COLONOSCOPY  2012   CYSTOSCOPY N/A 11/08/2012   Procedure: CYSTOSCOPY FLEXIBLE;  Surgeon: Craig So, MD;  Location: [facility];  Service: Urology;  Laterality: N/A;  NO SEEDS FOUND IN BLADDER    CYSTOSCOPY WITH RETROGRADE PYELOGRAM, URETEROSCOPY AND STENT PLACEMENT Right 10/01/2014   Procedure: CYSTOSCOPY WITH RETROGRADE PYELOGRAM, URETEROSCOPY, STONE REMOVAL, AND STENT PLACEMENT;  Surgeon: Craig Bring, MD;  Location: [facility];  Service: Urology;  Laterality: Right;   HOLMIUM LASER APPLICATION Right [id number]   Procedure: HOLMIUM LASER APPLICATION;  Surgeon: Craig Bring, MD;  Location: [facility];  Service: Urology;  Laterality: Right;   KIDNEY STONE SURGERY     ORCHIECTOMY Right 05/22/2018   Procedure: ORCHIECTOMY, RADICAL;  Surgeon: Craig Seal, MD;  Location: [facility];  Service: Urology;  Laterality: Right;   PROSTATE BIOPSY  06/25/2012   Adenocarcinoma   RADIOACTIVE SEED IMPLANT N/A 11/08/2012   Procedure:  61 RADIOACTIVE SEED IMPLANT;  Surgeon: Craig So, MD;  Location: [facility];  Service: Urology;  Laterality: N/A;      SEEDS IMPLANTED   TONSILLECTOMY  AGE 23   UMBILICAL HERNIA REPAIR  02/10/2011    Current Medications: Current Meds  Medication Sig   atorvastatin (LIPITOR) 10 MG tablet Take 10 mg by mouth daily.   hydrochlorothiazide (HYDRODIURIL) 25 MG tablet Take 12.5 mg by mouth daily.   Magnesium 400 MG CAPS Take 1 capsule by mouth every other day.   Multiple Vitamin (MULTIVITAMIN) capsule Take 1 capsule by mouth daily.   telmisartan (MICARDIS) 80 MG tablet Take 80 mg by mouth daily.   vitamin C (ASCORBIC ACID) 500 MG tablet Take 500 mg by mouth daily.   XARELTO 20 MG TABS tablet Take 20 mg by mouth daily.     Allergies:   Betadine [povidone iodine], Amoxicillin, Bee venom, and Chlorthalidone   Social History   Tobacco Use   Smoking status: Never   Smokeless tobacco: Never  Vaping Use   Vaping Use: Never used  Substance Use Topics   Alcohol use: Not Currently   Drug use: No     Family History: The patient's family history includes Cancer in his father; Dementia in his father and mother.  ROS:   Please see the history of present illness.     (+) Left costal pain (+) Chronic back pain All other systems reviewed and are negative.  EKGs/Labs/Other Studies Reviewed:    The following studies were reviewed today:  Coronary CTA 06/29/2022: IMPRESSION: 1. Coronary calcium score of 576. This was 74th percentile for age and sex matched control.   2.  Normal coronary origin with left dominance.   3.  Nonobstructive CAD   4.  Mixed plaque in proximal LAD causes mild (25-49%) stenosis   5. Calcified plaque causes minimal (0-24%) stenosis in mid LAD and proximal/mid LCX   CAD-RADS 2. Mild non-obstructive CAD (25-49%). Consider non-atherosclerotic causes of chest pain. Consider preventive therapy and risk factor modification.  CT Calcium Scoring  03/10/2022: CORONARY CALCIUM SCORES:   Left Main: 0   LAD: 540   LCx: 21.1   RCA: 0   Total Agatston Score: 561   MESA database percentile: Seventy-fifth   AORTA MEASUREMENTS: Ascending Aorta: 35 mm   Descending Aorta: 30 mm   OTHER FINDINGS:  Cardiovascular: The heart is normal in size.   Mediastinum/Nodes: Visualized esophagus and trachea within normal limits. No mediastinal lymphadenopathy.   Lungs/Pleura: The lungs are clear bilaterally.   Upper Abdomen: The visualized upper abdomen is within normal limits.   Musculoskeletal: No acute osseous abnormality.   IMPRESSION: Atherosclerotic calcifications about the left anterior descending and circumflex coronary arteries. The observed calcium score of 561 is at the seventy-fifth percentile for subjects of the same age, gender, and race/ethnicity who are free of clinical cardiovascular disease and treated diabetes.  Left LE Venous Doppler  05/04/2021: IMPRESSION: No evidence of femoropopliteal DVT within the LEFT lower extremity.  Echo  06/04/2020:  1. Left ventricular ejection fraction, by estimation, is 60 to 65%. The  left ventricle has normal function. The left ventricle has no regional  wall motion  abnormalities. Left ventricular diastolic parameters are  consistent with Grade I diastolic  dysfunction (impaired relaxation). The average left ventricular global  longitudinal strain is -19.5 %. The global longitudinal strain is normal.   2. Right ventricular systolic function is normal. The right ventricular  size is normal.   3. The mitral valve is normal in structure. Trivial mitral valve  regurgitation. No evidence of mitral stenosis.   4. The aortic valve is tricuspid. There is mild thickening of the aortic  valve. Aortic valve regurgitation is not visualized.   5. Aortic dilatation noted. There is mild dilatation of the aortic root,  measuring 38 mm.   6. The inferior vena cava is normal in size with greater than 50%  respiratory variability, suggesting right atrial pressure of 3 mmHg.   7. No atrial level shunt detected by color flow Doppler.   Comparison(s): No prior Echocardiogram.   EKG:  EKG is personally reviewed. 08/03/2022:  EKG was not ordered. 06/23/2022: Sinus rhythm. Nonspecific IVCD.  Imaging studies that I have independently reviewed today: CTA coronary 06/29/22  Recent Labs: 06/23/2022: BUN 20; Creatinine, Ser 1.04; Potassium 4.2; Sodium 142   Recent Lipid Panel    Component Value Date/Time   CHOL 107 08/08/2022 0819   TRIG 86 08/08/2022 0819   HDL 45 08/08/2022 0819   CHOLHDL 2.4 08/08/2022 0819   LDLCALC 45 08/08/2022 0819    Physical Exam:    VS:  BP 126/76   Pulse 88   Ht '6\' 2"'$  (1.88 m)   Wt 220 lb 12.8 oz (100.2 kg)   SpO2 94%   BMI 28.35 kg/m     Wt Readings from Last 5 Encounters:  08/03/22 220 lb 12.8 oz (100.2 kg)  06/23/22 230 lb 12.8 oz (104.7 kg)  12/24/20 229 lb (103.9 kg)  05/28/20 223 lb (101.2 kg)  05/23/20 218 lb (98.9 kg)    Constitutional: No acute distress Eyes: sclera non-icteric, normal conjunctiva and lids ENMT: normal dentition, moist mucous membranes Cardiovascular: regular rhythm, normal rate, no murmur. S1 and S2  normal. No jugular venous distention.  Respiratory: clear to auscultation bilaterally GI : normal bowel sounds, soft and nontender. No distention.   MSK: extremities warm, well perfused. No edema.  NEURO: grossly nonfocal exam, moves all extremities. PSYCH: alert and oriented x 3, normal mood and affect.   ASSESSMENT:    1. Elevated coronary artery calcium score   2. Precordial pain   3. Primary hypertension   4. Factor V Leiden ([place])   5. Deep vein thrombosis (DVT) of lower extremity, unspecified chronicity, unspecified laterality, unspecified vein (HCC)   6. Coronary artery disease involving native coronary artery  of native heart without angina pectoris     PLAN:    Elevated coronary artery calcium score - Plan: Lipid panel Coronary artery disease Precordial pain - coronary CTA with mild CAD and no obstructive disease.  - continue atorvastatin 10 mg daily and check if LDL has improved further for goal LDL <55 for aggressive secondary prevention. Shared decision making. - not on ASA, but does take Xarelto 20 mg daily.   Primary hypertension - cont HCTZ 25 mg daily - telmisartan 80 mg daily  Factor V Leiden (HCC) Deep vein thrombosis (DVT) of lower extremity, unspecified chronicity, unspecified laterality, unspecified vein (HCC) - continues on xarelto 20 mg daily for hx of recurrent DVT and factor 5 leiden mutation.   Plan: -FU in 1 year per patient preference. He notes that in the interim he will have his annual physical with his PCP.  Total time of encounter: 40 minutes total time of encounter, including 30 minutes spent in face-to-face patient care on the date of this encounter. This time includes coordination of care and counseling regarding above mentioned problem list. Remainder of non-face-to-face time involved reviewing chart documents/testing relevant to the patient encounter and documentation in the medical record. I have independently reviewed documentation from  referring provider.   Weston Brass, MD, [facility] [place]  [facility]   Shared Decision Making/Informed Consent:       Medication Adjustments/Labs and Tests Ordered: Current medicines are reviewed at length with the patient today.  Concerns regarding medicines are outlined above.   Orders Placed This Encounter  Procedures   Lipid panel   No orders of the defined types were placed in this encounter.  Patient Instructions  Medication Instructions:  No Changes In Medications at this time.  *If you need a refill on your cardiac medications before your next appointment, please call your pharmacy*  Lab Work: Please return for FASTING Blood Work AT NEXT AVAILABLE No appointment needed, lab here at the office is open Monday-Friday from 8AM to 4PM and closed daily for lunch from 12:45-1:45.   If you have labs (blood work) drawn today and your tests are completely normal, you will receive your results only by: MyChart Message (if you have MyChart) OR A paper copy in the mail If you have any lab test that is abnormal or we need to change your treatment, we will call you to review the results.  Follow-Up: At [facility], you and your health needs are our priority.  As part of our continuing mission to provide you with exceptional heart care, we have created designated Provider Care Teams.  These Care Teams include your primary Cardiologist (physician) and Advanced Practice Providers (APPs -  Physician Assistants and Nurse Practitioners) who all work together to provide you with the care you need, when you need it.  Your next appointment:   1 year(s)  Provider:   Parke Poisson, MD      [facility],acting as a scribe for Parke Poisson, MD.,have documented all relevant documentation on the behalf of Parke Poisson, MD,as directed by  Parke Poisson, MD while in the presence of Parke Poisson, MD.  I, Parke Poisson, MD, have reviewed all  documentation for the visit on 06/23/2022. The documentation on today's date of service for the exam, diagnosis, procedures, and orders are all accurate and complete.  ADDENDUM: 10/10/22  Recommendations: intensify therapy.  This is due to findings on plaque report as noted below:  Labs: LDL  45, Trig 86, HDL 45 (08/08/2022)  Total plaque volume on coronary CTA 509 mm3, this is categorized as severe plaque volume, with 76% of plaque documented as soft plaque and 24% calcified plaque. 1% of soft plaque is low attenuation. Recommendation is to increase atorvastatin from 10 mg to 40 mg. -------------------------------------------- Patient had plaque data provided as part of DECIDE registry from coronary CTA, management decisions as follows:  Did you add a medication? No  If yes, how many? 0  If no, reason? Reason for not adding med: Other increase therapy  Did you remove a medication? No  Did you increase the dosage of any medication? Yes  Did you decrease the dosage of any medication? No  Did you refer to a specialist (i.e. lipid clinic, preventive cardiology, endocrinology)? No  Has patient seen plaque report? No

## 2022-08-03 NOTE — Patient Instructions (Signed)
Medication Instructions:  No Changes In Medications at this time.  *If you need a refill on your cardiac medications before your next appointment, please call your pharmacy*  Lab Work: Please return for FASTING Blood Work AT NEXT AVAILABLE No appointment needed, lab here at the office is open Monday-Friday from 8AM to 4PM and closed daily for lunch from 12:45-1:45.   If you have labs (blood work) drawn today and your tests are completely normal, you will receive your results only by: [place] (if you have MyChart) OR A paper copy in the mail If you have any lab test that is abnormal or we need to change your treatment, we will call you to review the results.  Follow-Up: At [facility], you and your health needs are our priority.  As part of our continuing mission to provide you with exceptional heart care, we have created designated Provider Care Teams.  These Care Teams include your primary Cardiologist (physician) and Advanced Practice Providers (APPs -  Physician Assistants and Nurse Practitioners) who all work together to provide you with the care you need, when you need it.  Your next appointment:   1 year(s)  Provider:   Elouise Munroe, MD

## 2022-08-08 DIAGNOSIS — R931 Abnormal findings on diagnostic imaging of heart and coronary circulation: Secondary | ICD-10-CM | POA: Diagnosis not present

## 2022-08-09 LAB — LIPID PANEL
Chol/HDL Ratio: 2.4 ratio (ref 0.0–5.0)
Cholesterol, Total: 107 mg/dL (ref 100–199)
HDL: 45 mg/dL (ref >39)
LDL Chol Calc (NIH): 45 mg/dL (ref 0–99)
Triglycerides: 86 mg/dL (ref 0–149)
VLDL Cholesterol Cal: 17 mg/dL (ref 5–40)

## 2022-09-09 DIAGNOSIS — H524 Presbyopia: Secondary | ICD-10-CM | POA: Diagnosis not present

## 2022-09-14 DIAGNOSIS — R634 Abnormal weight loss: Secondary | ICD-10-CM | POA: Diagnosis not present

## 2022-09-14 DIAGNOSIS — R42 Dizziness and giddiness: Secondary | ICD-10-CM | POA: Diagnosis not present

## 2022-10-11 ENCOUNTER — Telehealth: Payer: Self-pay

## 2022-10-11 NOTE — Telephone Encounter (Signed)
Attempted to call patient, left message for patient to call back to office.   

## 2022-10-11 NOTE — Telephone Encounter (Signed)
-----   Message from Harvel Ricks, RN sent at 10/10/2022  9:41 AM EDT -----  ----- Message ----- From: Parke Poisson, MD Sent: 10/10/2022   8:50 AM EDT To: Merri Brunette, MD; Baird Cancer, RN  Plaque data from CCTA obtained as part of DECIDE registry. Please see addendum to progress note 08/03/22 for details.   Eileen Stanford, could you please call Mr. Scrima and let him know that we have additional information from his coronary CT scan on plaque volume. Plaque volume classified as severe, and based on that information, the next preventive step would be to increase his atorvastatin dose from 10 mg to 40 mg. He may choose to or not, but this is something we should consider to based on plaque analysis.   If he would like to increase to 20 mg and see how he feels that is fine. If doing well in a few weeks after, can consider increasing to 40 mg daily. Happy to discuss further at next follow up visit if he would prefer. If dose changes made, follow up labs with me or Dr. Renne Crigler in 2-3 months after.    Thanks, [place]

## 2022-10-13 ENCOUNTER — Telehealth: Payer: Self-pay | Admitting: Internal Medicine

## 2022-10-13 DIAGNOSIS — C44612 Basal cell carcinoma of skin of right upper limb, including shoulder: Secondary | ICD-10-CM | POA: Diagnosis not present

## 2022-10-13 DIAGNOSIS — L570 Actinic keratosis: Secondary | ICD-10-CM | POA: Diagnosis not present

## 2022-10-13 DIAGNOSIS — C44719 Basal cell carcinoma of skin of left lower limb, including hip: Secondary | ICD-10-CM | POA: Diagnosis not present

## 2022-10-13 DIAGNOSIS — Z85828 Personal history of other malignant neoplasm of skin: Secondary | ICD-10-CM | POA: Diagnosis not present

## 2022-10-13 DIAGNOSIS — D692 Other nonthrombocytopenic purpura: Secondary | ICD-10-CM | POA: Diagnosis not present

## 2022-10-13 DIAGNOSIS — D2261 Melanocytic nevi of right upper limb, including shoulder: Secondary | ICD-10-CM | POA: Diagnosis not present

## 2022-10-13 DIAGNOSIS — D224 Melanocytic nevi of scalp and neck: Secondary | ICD-10-CM | POA: Diagnosis not present

## 2022-10-13 NOTE — Telephone Encounter (Signed)
Pt returning nurses call from 4/30. Please advise

## 2022-10-13 NOTE — Telephone Encounter (Signed)
Please see other telephone encounter.

## 2022-10-13 NOTE — Telephone Encounter (Signed)
Returned call to patient and patients wife- reviewed message from Dr. Jacques Navy. Patient doesn't wish to increase atorva at this time due to LDL being less than 55 and patient strictly dieting. Cancelled patients follow up- per patient request due to all questions and concerns being answered. Advised patient to call back to office with any issues, questions, or concerns. Patient verbalized understanding.   MD is aware.

## 2022-10-13 NOTE — Telephone Encounter (Signed)
-----   Message ----- From: Parke Poisson, MD Sent: 10/10/2022   8:50 AM EDT To: Merri Brunette, MD; Baird Cancer, RN   Plaque data from CCTA obtained as part of DECIDE registry. Please see addendum to progress note 08/03/22 for details.    Eileen Stanford, could you please call Craig Booth and let him know that we have additional information from his coronary CT scan on plaque volume. Plaque volume classified as severe, and based on that information, the next preventive step would be to increase his atorvastatin dose from 10 mg to 40 mg. He may choose to or not, but this is something we should consider to based on plaque analysis.    If he would like to increase to 20 mg and see how he feels that is fine. If doing well in a few weeks after, can consider increasing to 40 mg daily. Happy to discuss further at next follow up visit if he would prefer. If dose changes made, follow up labs with me or Dr. Renne Crigler in 2-3 months after.     Thanks, GA ------------------------------------------------------------------------------------------ Pt states that he is unable to hear due to his hearing aids, continued call qit wife on Speakerphone. Pt/wife informed of providers result & recommendations. Wife states that she/they does not understand why this is coming now the scan was in Feb. Informed that this is a new reading for that CT scan and this is the recommendation. She states that they will decline to increase medication at the time because pt has been doing very well with his diet right now. Verbalized understanding. I have scheduled 1st available 7-3. At 820am. She states that she will discuss further at that time.

## 2022-10-22 ENCOUNTER — Encounter (HOSPITAL_COMMUNITY): Payer: Self-pay | Admitting: *Deleted

## 2022-10-22 ENCOUNTER — Other Ambulatory Visit: Payer: Self-pay

## 2022-10-22 ENCOUNTER — Ambulatory Visit (HOSPITAL_COMMUNITY)
Admission: EM | Admit: 2022-10-22 | Discharge: 2022-10-22 | Disposition: A | Discharge status: Home / Self Care | Payer: Medicare Other | Attending: Internal Medicine | Admitting: Internal Medicine

## 2022-10-22 DIAGNOSIS — S61216A Laceration without foreign body of right little finger without damage to nail, initial encounter: Secondary | ICD-10-CM | POA: Diagnosis not present

## 2022-10-22 DIAGNOSIS — Z7901 Long term (current) use of anticoagulants: Secondary | ICD-10-CM

## 2022-10-22 MED ORDER — LIDOCAINE HCL (PF) 2 % IJ SOLN
INTRAMUSCULAR | Status: AC
  Filled 2022-10-22: qty 5

## 2022-10-22 NOTE — ED Provider Notes (Signed)
[facility]    CSN: [id number] Arrival date & time: 10/22/22  1352      History   Chief Complaint Chief Complaint  Patient presents with   Laceration    HPI Craig Booth is a 72 y.o. male.   Patient presents to urgent care for evaluation of laceration to the palmar aspect of the right hand at the base of the fifth digit. Accidental laceration happened today while scraping paint with a putty knife. He takes Xarelto (Factor V Leiden mutation). Bleeding well controlled with pressure. No numbness or tingling distally to injury. Normal ROM to the right pinky finger. Unsure of date of last tetanus injection. He was able to rinse wound immediately after injury and has kept wound wrapped prior to arrival.      Past Medical History:  Diagnosis Date   Anticoagulated on Coumadin    Auditory vertigo involving left ear VIRAL EAR INFECTION IMPROVING   Compressed vertebrae    YRS AGO--  LUMBAR   Factor V Leiden mutation (HCC)    MONTIORED BY DR Jason Fila W/ [facility]   History of basal cell carcinoma excision    History of DVT of lower extremity RIGHT /NOV 2011   and right groin   History of kidney stones    History of phlebitis NOV 2011   Hypercholesterolemia    improved with diet, not taking medication   Hypertension    Incomplete RBBB    Migraine    Organic impotence    Pneumonia    Prostate cancer (HCC) 06/25/2012   Adenocarcinoma,gleason=3+3=6,PSA=3.8,volume=44cc   Transient global amnesia     Patient Active Problem List   Diagnosis Date Noted   Transient global amnesia 02/18/2020   Phlebitis    Prostate cancer (HCC) 06/13/2012   Umbilical hernia 12/22/2010   Hypertension 12/22/2010   Factor V Leiden (HCC) 12/22/2010    Past Surgical History:  Procedure Laterality Date   COLONOSCOPY  2012   CYSTOSCOPY N/A 11/08/2012   Procedure: CYSTOSCOPY FLEXIBLE;  Surgeon: Anner Crete, MD;  Location: [facility];  Service: Urology;  Laterality: N/A;   NO SEEDS FOUND IN BLADDER   CYSTOSCOPY WITH RETROGRADE PYELOGRAM, URETEROSCOPY AND STENT PLACEMENT Right 10/01/2014   Procedure: CYSTOSCOPY WITH RETROGRADE PYELOGRAM, URETEROSCOPY, STONE REMOVAL, AND STENT PLACEMENT;  Surgeon: Heloise Purpura, MD;  Location: [facility];  Service: Urology;  Laterality: Right;   HOLMIUM LASER APPLICATION Right 10/01/2014   Procedure: HOLMIUM LASER APPLICATION;  Surgeon: Heloise Purpura, MD;  Location: [facility];  Service: Urology;  Laterality: Right;   KIDNEY STONE SURGERY     ORCHIECTOMY Right 05/22/2018   Procedure: ORCHIECTOMY, RADICAL;  Surgeon: Bjorn Pippin, MD;  Location: [facility];  Service: Urology;  Laterality: Right;   PROSTATE BIOPSY  06/25/2012   Adenocarcinoma   RADIOACTIVE SEED IMPLANT N/A 11/08/2012   Procedure:  79 RADIOACTIVE SEED IMPLANT;  Surgeon: Anner Crete, MD;  Location: [facility];  Service: Urology;  Laterality: N/A;      SEEDS IMPLANTED   TONSILLECTOMY  AGE 77   UMBILICAL HERNIA REPAIR  02/10/2011       Home Medications    Prior to Admission medications   Medication Sig Start Date End Date Taking? Authorizing Provider  atorvastatin (LIPITOR) 10 MG tablet Take 10 mg by mouth daily.    [provider]  hydrochlorothiazide (HYDRODIURIL) 25 MG tablet Take 12.5 mg by mouth daily. 12/10/20   [provider]  Magnesium 400 MG  CAPS Take 1 capsule by mouth every other day.    [provider]  Multiple Vitamin (MULTIVITAMIN) capsule Take 1 capsule by mouth daily.    [provider]  telmisartan (MICARDIS) 80 MG tablet Take 80 mg by mouth daily.    [provider]  vitamin C (ASCORBIC ACID) 500 MG tablet Take 500 mg by mouth daily.    [provider]  XARELTO 20 MG TABS tablet Take 20 mg by mouth daily. 12/23/19   [provider]    Family History Family History  Problem Relation Age of Onset   Cancer Father        brian tumor, gliobalstoma   Dementia Father    Dementia  Mother     Social History Social History   Tobacco Use   Smoking status: Never   Smokeless tobacco: Never  Vaping Use   Vaping Use: Never used  Substance Use Topics   Alcohol use: Not Currently   Drug use: No     Allergies   Betadine [povidone iodine], Amoxicillin, Bee venom, and Chlorthalidone   Review of Systems Review of Systems Per HPI  Physical Exam Triage Vital Signs ED Triage Vitals  Enc Vitals Group     BP 10/22/22 1424 117/74     Pulse Rate 10/22/22 1424 66     Resp 10/22/22 1424 18     Temp 10/22/22 1424 (!) 97.5 F (36.4 C)     Temp src --      SpO2 10/22/22 1424 97 %     Weight --      Height --      Head Circumference --      Peak Flow --      Pain Score 10/22/22 1422 0     Pain Loc --      Pain Edu? --      Excl. in GC? --    No data found.  Updated Vital Signs BP 117/74   Pulse 66   Temp (!) 97.5 F (36.4 C)   Resp 18   SpO2 97%   Visual Acuity Right Eye Distance:   Left Eye Distance:   Bilateral Distance:    Right Eye Near:   Left Eye Near:    Bilateral Near:     Physical Exam Vitals and nursing note reviewed.  Constitutional:      Appearance: He is not ill-appearing or toxic-appearing.  HENT:     Head: Normocephalic and atraumatic.     Right Ear: Hearing and external ear normal.     Left Ear: Hearing and external ear normal.     Nose: Nose normal.     Mouth/Throat:     Lips: Pink.  Eyes:     General: Lids are normal. Vision grossly intact. Gaze aligned appropriately.     Extraocular Movements: Extraocular movements intact.     Conjunctiva/sclera: Conjunctivae normal.  Pulmonary:     Effort: Pulmonary effort is normal.  Musculoskeletal:     Right hand: Laceration (palmar aspect of the base of the right fifth digit) and tenderness (secondary to laceration) present. No swelling, deformity or bony tenderness. Normal range of motion. Normal strength. Normal sensation. There is no disruption of two-point discrimination.  Normal capillary refill (cap refill less than 3 to affected digit (fifth)). Normal pulse (+2 right radial pulse).     Left hand: Normal.     Cervical back: Neck supple.  Skin:    General: Skin is warm and dry.  Capillary Refill: Capillary refill takes less than 2 seconds.     Findings: No rash.  Neurological:     General: No focal deficit present.     Mental Status: He is alert and oriented to person, place, and time. Mental status is at baseline.     Cranial Nerves: No dysarthria or facial asymmetry.  Psychiatric:        Mood and Affect: Mood normal.        Speech: Speech normal.        Behavior: Behavior normal.        Thought Content: Thought content normal.        Judgment: Judgment normal.        UC Treatments / Results  Labs (all labs ordered are listed, but only abnormal results are displayed) Labs Reviewed - No data to display  EKG   Radiology No results found.  Procedures Laceration Repair  Date/Time: 10/22/2022 4:10 PM  Performed by: Carlisle Beers, FNP Authorized by: Carlisle Beers, FNP   Consent:    Consent obtained:  Verbal   Consent given by:  Patient   Risks, benefits, and alternatives were discussed: yes     Risks discussed:  Infection, need for additional repair, nerve damage, poor wound healing, poor cosmetic result, pain, retained foreign body, tendon damage and vascular damage   Alternatives discussed:  No treatment Universal protocol:    Procedure explained and questions answered to patient or proxy's satisfaction: yes     Patient identity confirmed:  Verbally with patient Anesthesia:    Anesthesia method:  Local infiltration   Local anesthetic:  Lidocaine 1% w/o epi Laceration details:    Location:  Hand   Hand location:  R palm   Length (cm):  2   Depth (mm):  5 Exploration:    Wound exploration: wound explored through full range of motion     Wound extent: no foreign bodies/material noted   Treatment:    Area  cleansed with:  Soap and water and Shur-Clens   Amount of cleaning:  Standard Skin repair:    Repair method:  Sutures   Suture size:  4-0   Suture material:  Prolene   Suture technique:  Simple interrupted   Number of sutures:  5 Approximation:    Approximation:  Close Repair type:    Repair type:  Simple Post-procedure details:    Dressing:  Non-adherent dressing   Procedure completion:  Tolerated well, no immediate complications Comments:     No betadine used in procedure due to allergy. Wound cleansed well with Shur-Clens and soap/water.  (including critical care time)  Medications Ordered in UC Medications - No data to display  Initial Impression / Assessment and Plan / UC Course  I have reviewed the triage vital signs and the nursing notes.  Pertinent labs & imaging results that were available during my care of the patient were reviewed by me and considered in my medical decision making (see chart for details).   1. Laceration of right little finger without foreign body and without damage to nail, chronic anticoagulation Laceration to the right fifth digit repaired. See procedure note above for details. Wound cleansed and dressed with nonstick dressing in clinic. Patient instructed to keep wound dry for 24 hours, then they may clean it with antibacterial soap and water gently. Advised not to scrub or rub site to avoid causing the sutures to separate. Dressing changes 2 times daily with nonstick dressing. No ointments, lotions, or  powders to the site until site heals. Suture removal in 10 days. Advised to monitor site for signs of infection (redness, swelling, pus, pain) and return to UC sooner than suture removal if infected. Tylenol may be used every 6 hours as needed for pain once numbing wears off. Advised to rest right hand and avoid exposure to potentially infectious environment. Encouraged to avoid activities that increase tension to the wound/sutures. Tdap  updated.  Discussed physical exam and available lab work findings in clinic with patient.  Counseled patient regarding appropriate use of medications and potential side effects for all medications recommended or prescribed today. Discussed red flag signs and symptoms of worsening condition,when to call the PCP office, return to urgent care, and when to seek higher level of care in the emergency department. Patient verbalizes understanding and agreement with plan. All questions answered. Patient discharged in stable condition.    Final Clinical Impressions(s) / UC Diagnoses   Final diagnoses:  Laceration of right little finger without foreign body without damage to nail, initial encounter  Chronic anticoagulation     Discharge Instructions      Wound care: Please keep the area surrounding the wound/sutures clean and dry for the next 24 hours. After 24 hours, you may get the wound wet. Gently clean wound with antibacterial soap. Do not scrub wound. Cover the area with a nonstick bandage and change the bandage 2 times a day.   You should have the sutures removed in 10 days by your primary care provider or at urgent care. Return sooner than 10 days if you experience discharge from your laceration, redness around your laceration, warmth around your laceration, or fever.   You may take 600mg  ibuprofen and/or tylenol 1,000mg  every 6 hours as needed for aches/pains to your laceration once the numbing wears off.   Thanks for letting me fix your cut today! Feel better!    ED Prescriptions   None    PDMP not reviewed this encounter.   Carlisle Beers, [place] 10/24/22 2215

## 2022-10-22 NOTE — ED Triage Notes (Signed)
Pt reports he is on blood thinners and has a lac to RT small finger. Pt cut his RT small finger today while scrapping paint with a putty knife . Bleeding controlled on arrival to room.

## 2022-10-22 NOTE — Discharge Instructions (Signed)
Wound care: Please keep the area surrounding the wound/sutures clean and dry for the next 24 hours. After 24 hours, you may get the wound wet. Gently clean wound with antibacterial soap. Do not scrub wound. Cover the area with a nonstick bandage and change the bandage 2 times a day.   You should have the sutures removed in 10 days by your primary care provider or at urgent care. Return sooner than 10 days if you experience discharge from your laceration, redness around your laceration, warmth around your laceration, or fever.   You may take 600mg ibuprofen and/or tylenol 1,000mg every 6 hours as needed for aches/pains to your laceration once the numbing wears off.   Thanks for letting me fix your cut today! Feel better! 

## 2022-10-25 DIAGNOSIS — I10 Essential (primary) hypertension: Secondary | ICD-10-CM | POA: Diagnosis not present

## 2022-10-31 DIAGNOSIS — S61216S Laceration without foreign body of right little finger without damage to nail, sequela: Secondary | ICD-10-CM | POA: Diagnosis not present

## 2022-10-31 DIAGNOSIS — I10 Essential (primary) hypertension: Secondary | ICD-10-CM | POA: Diagnosis not present

## 2022-10-31 DIAGNOSIS — Z4802 Encounter for removal of sutures: Secondary | ICD-10-CM | POA: Diagnosis not present

## 2022-10-31 DIAGNOSIS — Z09 Encounter for follow-up examination after completed treatment for conditions other than malignant neoplasm: Secondary | ICD-10-CM | POA: Diagnosis not present

## 2022-12-14 ENCOUNTER — Ambulatory Visit: Payer: Medicare Other | Admitting: Internal Medicine

## 2022-12-19 DIAGNOSIS — Z125 Encounter for screening for malignant neoplasm of prostate: Secondary | ICD-10-CM | POA: Diagnosis not present

## 2022-12-19 DIAGNOSIS — I10 Essential (primary) hypertension: Secondary | ICD-10-CM | POA: Diagnosis not present

## 2022-12-19 DIAGNOSIS — R7309 Other abnormal glucose: Secondary | ICD-10-CM | POA: Diagnosis not present

## 2022-12-22 DIAGNOSIS — I8393 Asymptomatic varicose veins of bilateral lower extremities: Secondary | ICD-10-CM | POA: Diagnosis not present

## 2022-12-22 DIAGNOSIS — R5381 Other malaise: Secondary | ICD-10-CM | POA: Diagnosis not present

## 2022-12-22 DIAGNOSIS — Z0000 Encounter for general adult medical examination without abnormal findings: Secondary | ICD-10-CM | POA: Diagnosis not present

## 2022-12-22 DIAGNOSIS — Z8547 Personal history of malignant neoplasm of testis: Secondary | ICD-10-CM | POA: Diagnosis not present

## 2022-12-22 DIAGNOSIS — I10 Essential (primary) hypertension: Secondary | ICD-10-CM | POA: Diagnosis not present

## 2022-12-22 DIAGNOSIS — R7309 Other abnormal glucose: Secondary | ICD-10-CM | POA: Diagnosis not present

## 2023-01-05 DIAGNOSIS — Z8547 Personal history of malignant neoplasm of testis: Secondary | ICD-10-CM | POA: Diagnosis not present

## 2023-01-09 DIAGNOSIS — Z8547 Personal history of malignant neoplasm of testis: Secondary | ICD-10-CM | POA: Diagnosis not present

## 2023-01-09 DIAGNOSIS — Z8546 Personal history of malignant neoplasm of prostate: Secondary | ICD-10-CM | POA: Diagnosis not present

## 2023-02-09 IMAGING — US US EXTREM LOW VENOUS*L*
1 series · 14 of 24 positions shown · non-contrast
Comparison: CT AP, 12/28/2020.

CLINICAL DATA: History of DVT and cancer.  Factor V Leiden.

EXAM:
LEFT LOWER EXTREMITY VENOUS DOPPLER ULTRASOUND
TECHNIQUE: Gray-scale sonography with compression, as well as color and duplex
ultrasound, were performed to evaluate the deep venous system(s)
from the level of the common femoral vein through the popliteal and
proximal calf veins.

[Series 1: us extrem low venous*left* · 0.08mm/px · 14 of 41 slices shown]
[im 1/41]
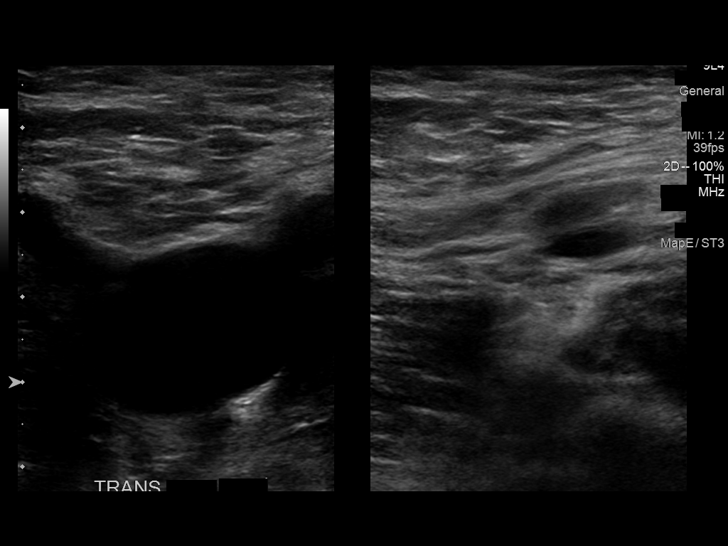
[im 4/41]
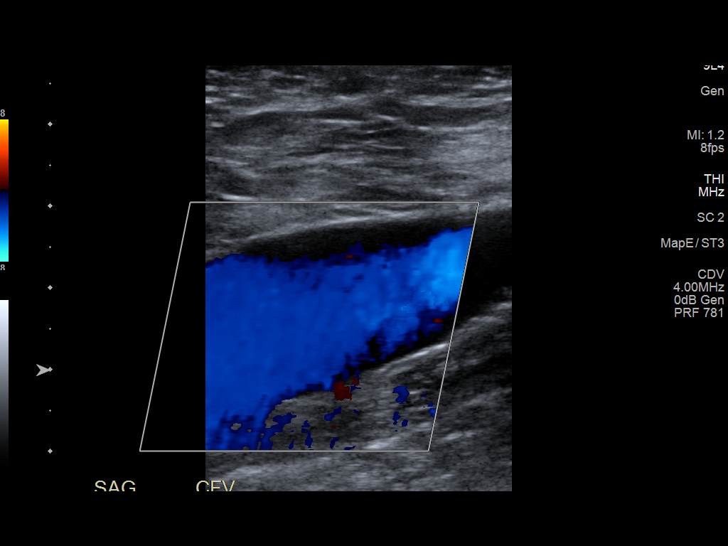
[im 7/41]
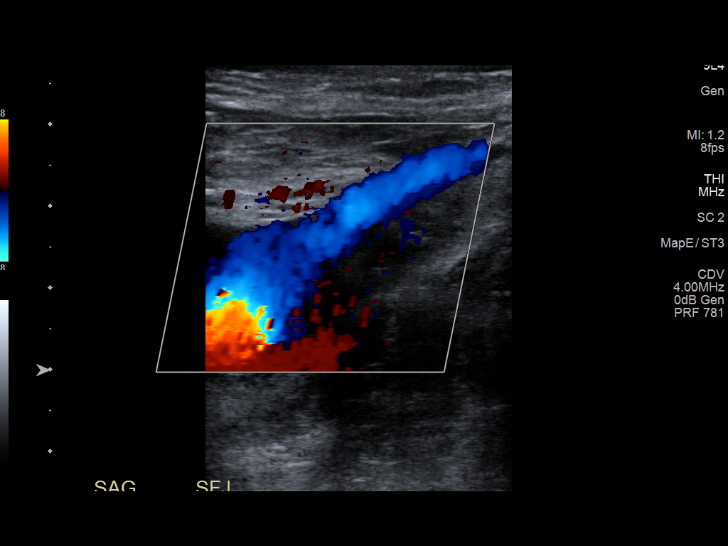
[im 11/41]
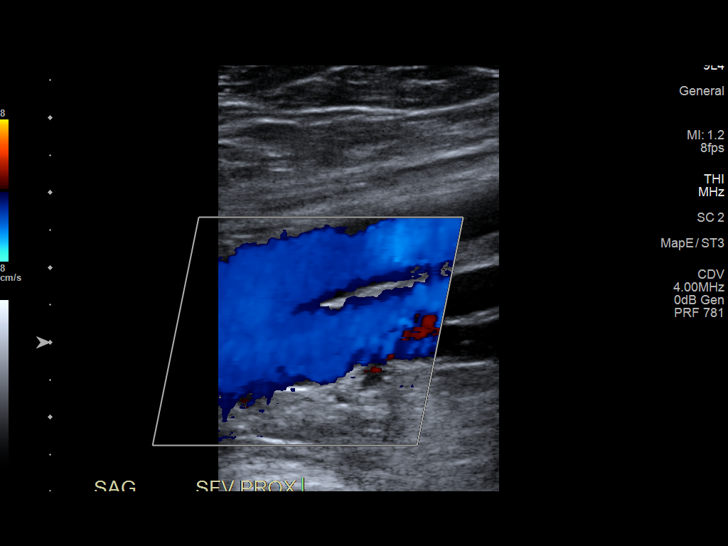
[im 13/41]
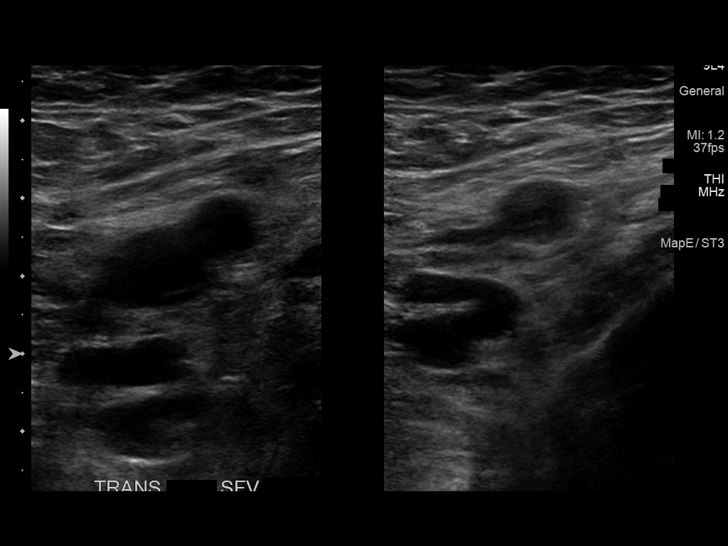
[im 16/41]
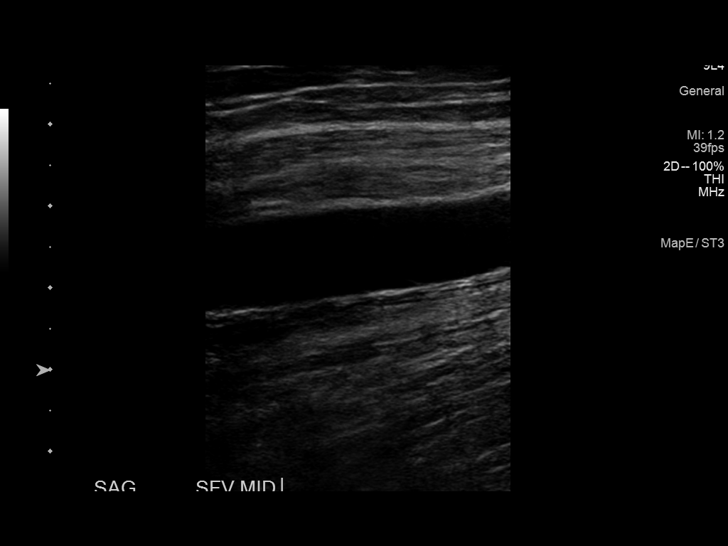
[im 20/41]
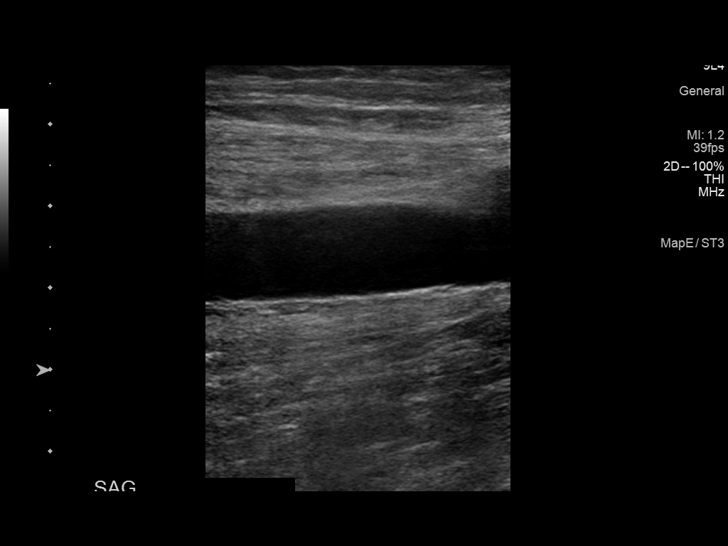
[im 21/41]
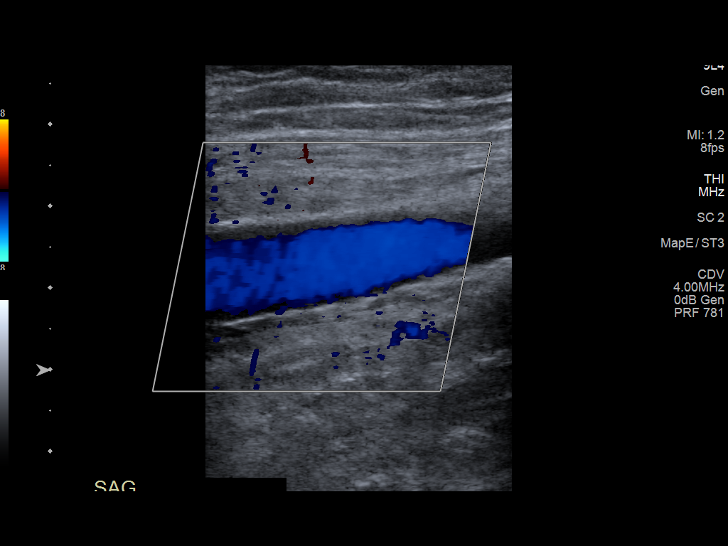
[im 25/41]
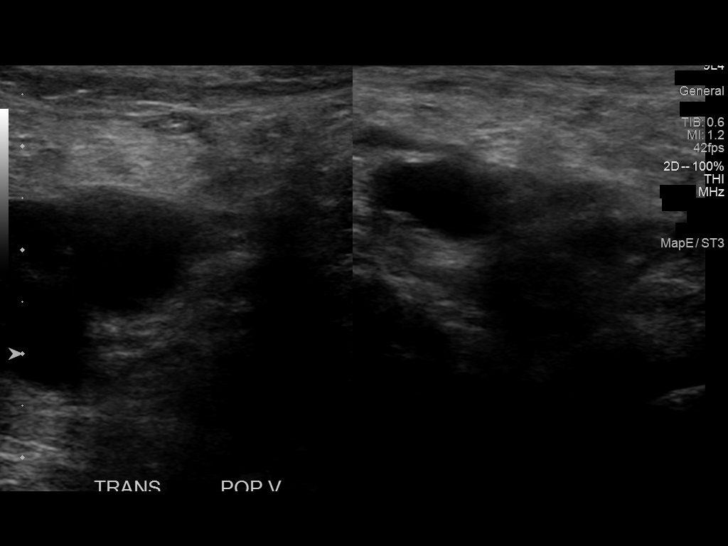
[im 28/41]
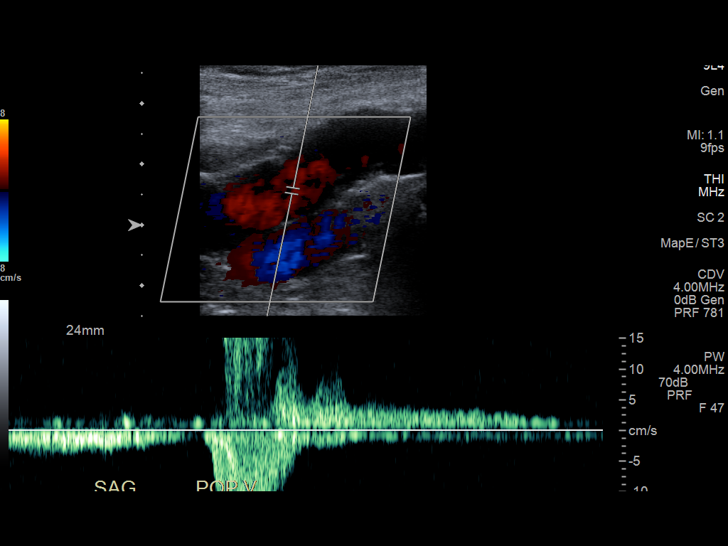
[im 32/41]
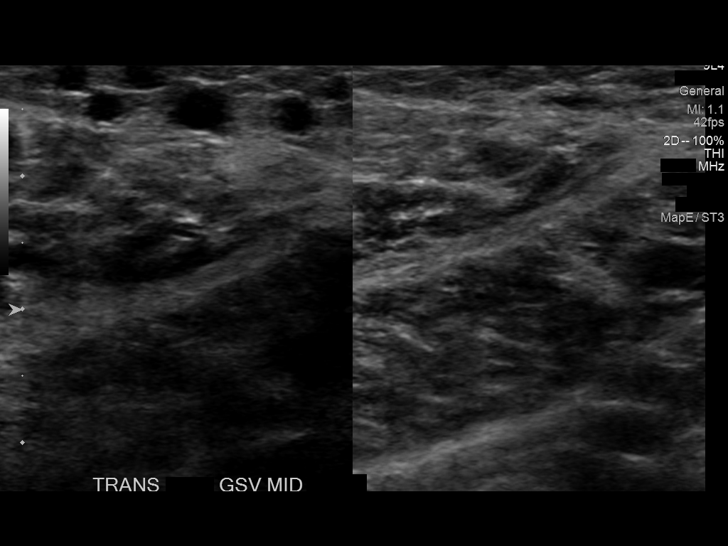
[im 34/41]
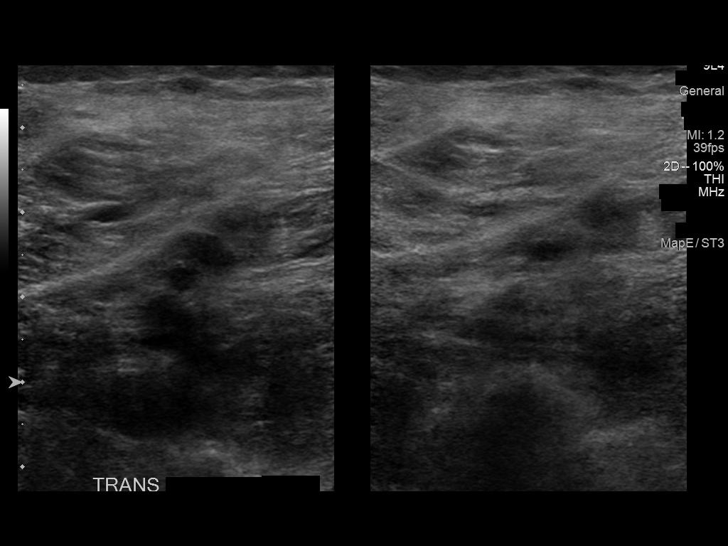
[im 37/41]
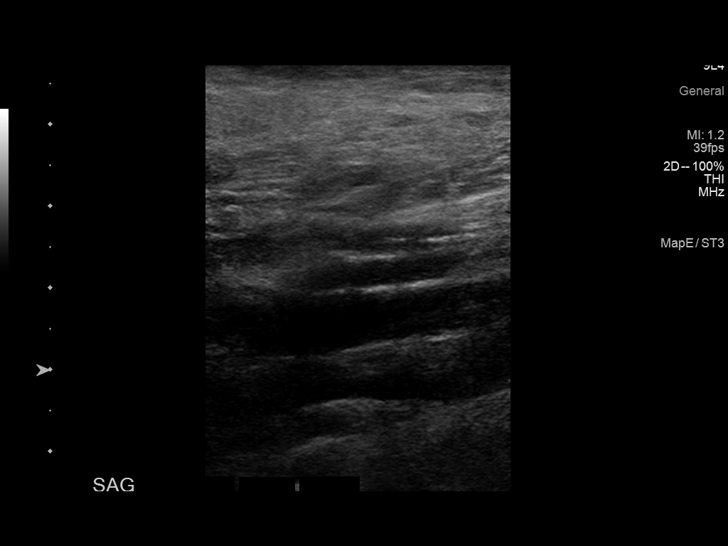
[im 41/41]
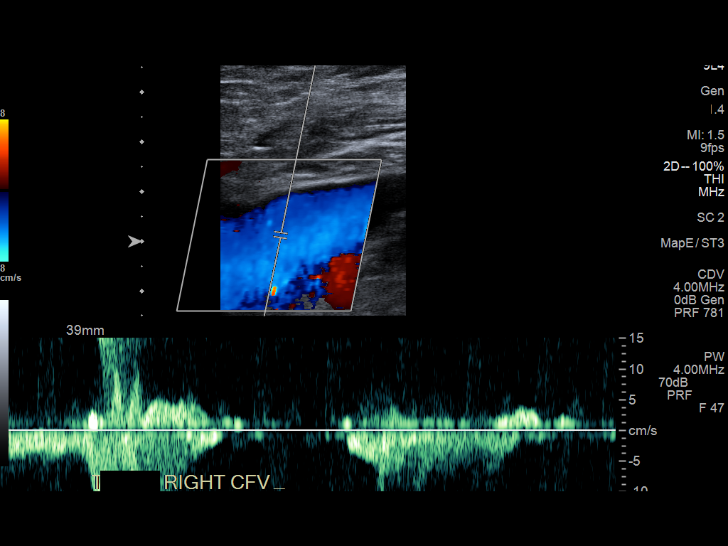

[14 of 24 positions shown; findings below may reference images not displayed]

FINDINGS: VENOUS

Normal compressibility of the common femoral, superficial femoral,
and popliteal veins, as well as the visualized calf veins.
Visualized portions of profunda femoral vein and great saphenous
vein unremarkable. No filling defects to suggest DVT on grayscale or
color Doppler imaging. Doppler waveforms show normal direction of
venous flow, normal respiratory plasticity and response to
augmentation.

Limited views of the contralateral common femoral vein are
unremarkable.

OTHER

No evidence of superficial thrombophlebitis or abnormal fluid
collection.

Limitations: none
IMPRESSION: No evidence of femoropopliteal DVT within the LEFT lower extremity.

## 2023-04-24 DIAGNOSIS — L821 Other seborrheic keratosis: Secondary | ICD-10-CM | POA: Diagnosis not present

## 2023-04-24 DIAGNOSIS — C44519 Basal cell carcinoma of skin of other part of trunk: Secondary | ICD-10-CM | POA: Diagnosis not present

## 2023-04-24 DIAGNOSIS — L570 Actinic keratosis: Secondary | ICD-10-CM | POA: Diagnosis not present

## 2023-04-24 DIAGNOSIS — Z85828 Personal history of other malignant neoplasm of skin: Secondary | ICD-10-CM | POA: Diagnosis not present

## 2023-04-24 DIAGNOSIS — D1801 Hemangioma of skin and subcutaneous tissue: Secondary | ICD-10-CM | POA: Diagnosis not present

## 2023-04-24 DIAGNOSIS — L814 Other melanin hyperpigmentation: Secondary | ICD-10-CM | POA: Diagnosis not present

## 2023-04-24 DIAGNOSIS — M7582 Other shoulder lesions, left shoulder: Secondary | ICD-10-CM | POA: Diagnosis not present

## 2023-05-03 DIAGNOSIS — Z1211 Encounter for screening for malignant neoplasm of colon: Secondary | ICD-10-CM | POA: Diagnosis not present

## 2023-05-03 DIAGNOSIS — K5730 Diverticulosis of large intestine without perforation or abscess without bleeding: Secondary | ICD-10-CM | POA: Diagnosis not present

## 2023-06-28 DIAGNOSIS — H52223 Regular astigmatism, bilateral: Secondary | ICD-10-CM | POA: Diagnosis not present

## 2023-07-05 DIAGNOSIS — I10 Essential (primary) hypertension: Secondary | ICD-10-CM | POA: Diagnosis not present

## 2023-07-13 DIAGNOSIS — K080 Exfoliation of teeth due to systemic causes: Secondary | ICD-10-CM | POA: Diagnosis not present

## 2023-07-20 DIAGNOSIS — K080 Exfoliation of teeth due to systemic causes: Secondary | ICD-10-CM | POA: Diagnosis not present

## 2023-07-24 DIAGNOSIS — K080 Exfoliation of teeth due to systemic causes: Secondary | ICD-10-CM | POA: Diagnosis not present

## 2023-07-31 DIAGNOSIS — I10 Essential (primary) hypertension: Secondary | ICD-10-CM | POA: Diagnosis not present

## 2023-08-04 ENCOUNTER — Ambulatory Visit: Payer: Medicare Other | Admitting: Internal Medicine

## 2023-10-23 DIAGNOSIS — D2261 Melanocytic nevi of right upper limb, including shoulder: Secondary | ICD-10-CM | POA: Diagnosis not present

## 2023-10-23 DIAGNOSIS — D2262 Melanocytic nevi of left upper limb, including shoulder: Secondary | ICD-10-CM | POA: Diagnosis not present

## 2023-10-23 DIAGNOSIS — Z85828 Personal history of other malignant neoplasm of skin: Secondary | ICD-10-CM | POA: Diagnosis not present

## 2023-10-23 DIAGNOSIS — D225 Melanocytic nevi of trunk: Secondary | ICD-10-CM | POA: Diagnosis not present

## 2023-10-23 DIAGNOSIS — C44519 Basal cell carcinoma of skin of other part of trunk: Secondary | ICD-10-CM | POA: Diagnosis not present

## 2023-10-23 DIAGNOSIS — L570 Actinic keratosis: Secondary | ICD-10-CM | POA: Diagnosis not present

## 2023-10-27 ENCOUNTER — Encounter: Payer: Self-pay | Admitting: Internal Medicine

## 2023-10-27 ENCOUNTER — Ambulatory Visit: Payer: Medicare Other | Attending: Internal Medicine | Admitting: Internal Medicine

## 2023-10-27 VITALS — BP 110/80 | HR 57 | Ht 75.0 in | Wt 200.0 lb

## 2023-10-27 DIAGNOSIS — I10 Essential (primary) hypertension: Secondary | ICD-10-CM

## 2023-10-27 DIAGNOSIS — E782 Mixed hyperlipidemia: Secondary | ICD-10-CM | POA: Diagnosis not present

## 2023-10-27 DIAGNOSIS — I2510 Atherosclerotic heart disease of native coronary artery without angina pectoris: Secondary | ICD-10-CM | POA: Diagnosis not present

## 2023-10-27 MED ORDER — OLMESARTAN MEDOXOMIL 20 MG PO TABS: 20.0000 mg | ORAL_TABLET | Freq: Every day | ORAL | Status: AC

## 2023-10-27 NOTE — Progress Notes (Signed)
 Cardiology Office Note:  .   Date:  10/27/2023  ID:  Craig Booth, DOB [birth year], MRN [id number] PCP: Imelda Man, MD  [facility] Providers Cardiologist:  Euell Herrlich, MD    History of Present Illness: .   Craig Booth is a 73 y.o. male.  Discussed the use of AI scribe software for clinical note transcription with the patient, who gave verbal consent to proceed.  History of Present Illness Craig Booth is a 73 year old male with hypertension and coronary artery disease who presents for a cardiovascular follow-up.  He has lost 40 to 45 pounds over the past year due to dietary changes, with a current weight goal of 185 pounds. He experienced a fall due to orthostatic hypotension, attributed to blood pressure fluctuations during weight loss. He continues to experience lightheadedness and dizziness, especially when standing. Blood pressure readings are mostly stable, ranging from 110 to 130 systolic and 65 to 80 diastolic, with occasional readings in the 130s. He takes hydrochlorothiazide 12.5 mg and olmesartan  20 mg daily, having reduced from 40 mg due to low readings. Telmisartan has been discontinued.  He experiences episodes of disorientation and tachycardia once or twice a month, without chest pain or pressure during physical activity. Post-exercise, he notes a drop in blood pressure, which he manages by resting. He is working towards running a mile in ten minutes. We discussed compression garments.  Cholesterol management includes atorvastatin, increased to 20 mg from 10 mg. Recent labs show LDL at 61, triglycerides at 65, and HDL at 52. Follow-up labs are scheduled with his primary care provider. A coronary CTA from January 2024 showed a calcium score of 576, with no chest discomfort or pressure during exercise.    ROS: negative except per HPI above.  Studies Reviewed: Aaron Aas   EKG Interpretation Date/Time:  Friday Oct 27 2023 07:54:16 EDT Ventricular Rate:   57 PR Interval:  216 QRS Duration:  122 QT Interval:  436 QTC Calculation: 424 R Axis:   70  Text Interpretation: Sinus bradycardia with 1st degree A-V block Non-specific intra-ventricular conduction delay When compared with ECG of 11-Feb-2020 15:31, PR interval has increased Vent. rate has decreased BY  30 BPM Criteria for Inferior infarct are no longer Present T wave inversion no longer evident in Inferior leads Confirmed by Grady Lawman ([id number]) on 10/27/2023 8:13:16 AM    Results LABS LDL: 61 (2024) Triglycerides: 65 (2024) HDL: 52 (2024)  RADIOLOGY Coronary CTA: Calcium score 576, mild plaque blockage (06/2022) Risk Assessment/Calculations:       Physical Exam:   VS:  BP 110/80   Pulse (!) 57   Ht 6\' 3"  (1.905 m)   Wt 200 lb (90.7 kg)   SpO2 95%   BMI 25.00 kg/m    Wt Readings from Last 3 Encounters:  10/27/23 200 lb (90.7 kg)  08/03/22 220 lb 12.8 oz (100.2 kg)  06/23/22 230 lb 12.8 oz (104.7 kg)     Physical Exam GENERAL: Alert, cooperative, well developed, no acute distress. HEENT: Normocephalic, normal oropharynx, moist mucous membranes. CHEST: Clear to auscultation bilaterally, no wheezes, rhonchi, or crackles. CARDIOVASCULAR: Normal heart rate and rhythm, S1 and S2 normal without murmurs. ABDOMEN: Soft, non-tender, non-distended, without organomegaly, normal bowel sounds. EXTREMITIES: No cyanosis or edema. NEUROLOGICAL: Cranial nerves grossly intact, moves all extremities without gross motor or sensory deficit.   ASSESSMENT AND PLAN: .    Assessment and Plan Assessment & Plan Coronary artery disease with mild plaque Mild plaque on coronary  CTA, calcium score 576, no significant obstruction. Asymptomatic during physical activity. Slower electrical conduction on EKG. Focus on lifestyle modifications and medication to prevent progression. - Continue current lifestyle modifications and medication regimen. - Monitor for new symptoms such as chest pain or  discomfort during exertion. - Report any unusual changes in exercise tolerance.  Hypertension Well-controlled with blood pressure 110-130/65-80 mmHg. Reduction of olmesartan  from 40 mg to 20 mg stabilized blood pressure, reducing hypotension episodes. Occasional lightheadedness not concerning unless symptomatic with low blood pressure. Post-exercise blood pressure drops due to aggressive recovery response exacerbated by medication. - Continue olmesartan  20 mg daily. - Monitor blood pressure regularly. - Report symptomatic hypotension or significant blood pressure changes. - Consider sports compression socks and electrolyte drinks for post-exercise hypotension.  Hyperlipidemia Managed with atorvastatin, increased to 20 mg. Last lipid panel: LDL 61 mg/dL, triglycerides 65 mg/dL, HDL 52 mg/dL. Goal: reduce LDL below 55 mg/dL to potentially regress coronary plaque. Statins do not cause dementia; alternatives like injectable cholesterol medications available if concerns arise. - Continue atorvastatin 20 mg daily. - Follow up with lipid panel in the summer with Dr. Lucina Sabal.      Grady Lawman, MD, FACC

## 2023-10-27 NOTE — Patient Instructions (Signed)
 Medication Instructions:  No Changes  Lab Work: None  Follow-Up: At [facility], you and your health needs are our priority.  As part of our continuing mission to provide you with exceptional heart care, our providers are all part of one team.  This team includes your primary Cardiologist (physician) and Advanced Practice Providers or APPs (Physician Assistants and Nurse Practitioners) who all work together to provide you with the care you need, when you need it.  Your next appointment:   1 year(s)  Provider:   Gayatri A Acharya, MD     Other Instructions Please call us  or send a MyChart message with any Cardiology related questions/concerns.  [phone].  Thank you!

## 2023-11-21 DIAGNOSIS — C44519 Basal cell carcinoma of skin of other part of trunk: Secondary | ICD-10-CM | POA: Diagnosis not present

## 2023-11-27 DIAGNOSIS — S91332A Puncture wound without foreign body, left foot, initial encounter: Secondary | ICD-10-CM | POA: Diagnosis not present

## 2023-11-27 DIAGNOSIS — Z23 Encounter for immunization: Secondary | ICD-10-CM | POA: Diagnosis not present

## 2023-11-27 DIAGNOSIS — M79672 Pain in left foot: Secondary | ICD-10-CM | POA: Diagnosis not present

## 2023-12-25 DIAGNOSIS — E7800 Pure hypercholesterolemia, unspecified: Secondary | ICD-10-CM | POA: Diagnosis not present

## 2023-12-25 DIAGNOSIS — R7309 Other abnormal glucose: Secondary | ICD-10-CM | POA: Diagnosis not present

## 2023-12-25 DIAGNOSIS — I10 Essential (primary) hypertension: Secondary | ICD-10-CM | POA: Diagnosis not present

## 2023-12-25 DIAGNOSIS — Z125 Encounter for screening for malignant neoplasm of prostate: Secondary | ICD-10-CM | POA: Diagnosis not present

## 2023-12-25 LAB — LAB REPORT - SCANNED
A1c: 5.9
EGFR: 76

## 2023-12-28 DIAGNOSIS — Z0000 Encounter for general adult medical examination without abnormal findings: Secondary | ICD-10-CM | POA: Diagnosis not present

## 2023-12-28 DIAGNOSIS — I10 Essential (primary) hypertension: Secondary | ICD-10-CM | POA: Diagnosis not present

## 2023-12-28 DIAGNOSIS — I2510 Atherosclerotic heart disease of native coronary artery without angina pectoris: Secondary | ICD-10-CM | POA: Diagnosis not present

## 2023-12-28 DIAGNOSIS — J302 Other seasonal allergic rhinitis: Secondary | ICD-10-CM | POA: Diagnosis not present

## 2023-12-28 DIAGNOSIS — Z8547 Personal history of malignant neoplasm of testis: Secondary | ICD-10-CM | POA: Diagnosis not present

## 2024-01-08 ENCOUNTER — Encounter: Payer: Self-pay | Admitting: Internal Medicine

## 2024-01-14 ENCOUNTER — Ambulatory Visit: Payer: Self-pay | Admitting: Internal Medicine

## 2024-03-04 DIAGNOSIS — J069 Acute upper respiratory infection, unspecified: Secondary | ICD-10-CM | POA: Diagnosis not present

## 2024-04-15 DIAGNOSIS — L570 Actinic keratosis: Secondary | ICD-10-CM | POA: Diagnosis not present

## 2024-04-15 DIAGNOSIS — D2262 Melanocytic nevi of left upper limb, including shoulder: Secondary | ICD-10-CM | POA: Diagnosis not present

## 2024-04-15 DIAGNOSIS — D225 Melanocytic nevi of trunk: Secondary | ICD-10-CM | POA: Diagnosis not present

## 2024-04-15 DIAGNOSIS — Z85828 Personal history of other malignant neoplasm of skin: Secondary | ICD-10-CM | POA: Diagnosis not present

## 2024-04-15 DIAGNOSIS — D2261 Melanocytic nevi of right upper limb, including shoulder: Secondary | ICD-10-CM | POA: Diagnosis not present

## 2024-05-27 DIAGNOSIS — K080 Exfoliation of teeth due to systemic causes: Secondary | ICD-10-CM | POA: Diagnosis not present
